# Patient Record
Sex: Female | Born: 1935 | Race: White | Hispanic: No | Marital: Married | Smoking: Former smoker
Health system: Southern US, Community
[De-identification: ages and names within clinical notes are randomized; demographics above are authoritative.]

## PROBLEM LIST (undated history)

## (undated) DIAGNOSIS — T8859XA Other complications of anesthesia, initial encounter: Secondary | ICD-10-CM

## (undated) DIAGNOSIS — T4145XA Adverse effect of unspecified anesthetic, initial encounter: Secondary | ICD-10-CM

## (undated) DIAGNOSIS — Z9889 Other specified postprocedural states: Secondary | ICD-10-CM

## (undated) DIAGNOSIS — F40240 Claustrophobia: Secondary | ICD-10-CM

## (undated) DIAGNOSIS — J40 Bronchitis, not specified as acute or chronic: Secondary | ICD-10-CM

## (undated) DIAGNOSIS — G4730 Sleep apnea, unspecified: Secondary | ICD-10-CM

## (undated) DIAGNOSIS — R011 Cardiac murmur, unspecified: Secondary | ICD-10-CM

## (undated) DIAGNOSIS — Z9109 Other allergy status, other than to drugs and biological substances: Secondary | ICD-10-CM

## (undated) DIAGNOSIS — M1990 Unspecified osteoarthritis, unspecified site: Secondary | ICD-10-CM

## (undated) DIAGNOSIS — R112 Nausea with vomiting, unspecified: Secondary | ICD-10-CM

## (undated) DIAGNOSIS — G2581 Restless legs syndrome: Secondary | ICD-10-CM

## (undated) DIAGNOSIS — R0602 Shortness of breath: Secondary | ICD-10-CM

## (undated) DIAGNOSIS — R52 Pain, unspecified: Secondary | ICD-10-CM

## (undated) DIAGNOSIS — E039 Hypothyroidism, unspecified: Secondary | ICD-10-CM

## (undated) DIAGNOSIS — I10 Essential (primary) hypertension: Secondary | ICD-10-CM

## (undated) HISTORY — PX: OTHER SURGICAL HISTORY: SHX169

## (undated) HISTORY — PX: CARDIAC CATHETERIZATION: SHX172

## (undated) HISTORY — PX: APPENDECTOMY: SHX54

---

## 1962-11-07 HISTORY — PX: OTHER SURGICAL HISTORY: SHX169

## 1980-11-07 HISTORY — PX: BREAST SURGERY: SHX581

## 1998-11-07 HISTORY — PX: ABDOMINAL HYSTERECTOMY: SHX81

## 2009-04-24 ENCOUNTER — Ambulatory Visit: Payer: Self-pay | Admitting: Family Medicine

## 2009-04-24 DIAGNOSIS — M25559 Pain in unspecified hip: Secondary | ICD-10-CM | POA: Insufficient documentation

## 2009-04-24 DIAGNOSIS — I10 Essential (primary) hypertension: Secondary | ICD-10-CM | POA: Insufficient documentation

## 2009-04-24 DIAGNOSIS — M2170 Unequal limb length (acquired), unspecified site: Secondary | ICD-10-CM | POA: Insufficient documentation

## 2009-11-07 HISTORY — PX: EYE SURGERY: SHX253

## 2009-12-08 ENCOUNTER — Ambulatory Visit: Payer: Self-pay | Admitting: Family Medicine

## 2009-12-08 DIAGNOSIS — E039 Hypothyroidism, unspecified: Secondary | ICD-10-CM | POA: Insufficient documentation

## 2009-12-08 DIAGNOSIS — J069 Acute upper respiratory infection, unspecified: Secondary | ICD-10-CM | POA: Insufficient documentation

## 2009-12-08 LAB — CONVERTED CEMR LAB: Rapid Strep: NEGATIVE

## 2010-03-11 ENCOUNTER — Ambulatory Visit: Payer: Self-pay | Admitting: Family Medicine

## 2010-03-11 LAB — CONVERTED CEMR LAB
Basophils Absolute: 0.1 10*3/uL (ref 0.0–0.1)
Bilirubin Urine: NEGATIVE
Glucose, Bld: 131 mg/dL — ABNORMAL HIGH (ref 70–99)
Glucose, Urine, Semiquant: NEGATIVE
Hemoglobin: 14 g/dL (ref 12.0–15.0)
Lymphocytes Relative: 16 % (ref 12–46)
Monocytes Absolute: 0.8 10*3/uL (ref 0.1–1.0)
Neutro Abs: 7.1 10*3/uL (ref 1.7–7.7)
Nitrite: POSITIVE
Potassium: 3.9 meq/L (ref 3.5–5.3)
RDW: 14.8 % (ref 11.5–15.5)
Sodium: 132 meq/L — ABNORMAL LOW (ref 135–145)
pH: 5

## 2010-03-15 ENCOUNTER — Encounter: Payer: Self-pay | Admitting: Family Medicine

## 2010-12-07 NOTE — Letter (Signed)
Summary: Handout Printed  Printed Handout:  - Rheumatic Fever 

## 2010-12-07 NOTE — Assessment & Plan Note (Signed)
Summary: FEVER(3DAYS)/SEVERE HEADACHE/UPSET STOMACH   Vital Signs:  Patient Profile:   76 Years Old Female CC:      Diarrhea, HA, fever X 3 days, chills, dry mouth Height:     60 inches Weight:      228 pounds O2 Sat:      95 % O2 treatment:    Room Air Temp:     99.3 degrees F oral Pulse rate:   100 / minute Pulse rhythm:   regular Resp:     22 per minute BP sitting:   140 / 73  (right arm) Cuff size:   large  Pt. in pain?   no  Vitals Entered By: Lajean Saver RN (Mar 11, 2010 9:53 AM)                    Updated Prior Medication List: MIRAPEX 0.5 MG TABS (PRAMIPEXOLE DIHYDROCHLORIDE)  TWYNSTA 80-5 MG TABS (TELMISARTAN-AMLODIPINE)  INDAPAMIDE 1.25 MG TABS (INDAPAMIDE)  SAVELLA 50 MG TABS (MILNACIPRAN HCL)  LORTAB 5 5-500 MG TABS (HYDROCODONE-ACETAMINOPHEN) One or two tabs by mouth hs as needed pain SYNTHROID 112 MCG TABS (LEVOTHYROXINE SODIUM) once daily LOVAZA 1 GM CAPS (OMEGA-3-ACID ETHYL ESTERS) 2 grams bid  Current Allergies (reviewed today): No known allergies History of Present Illness Chief Complaint: Diarrhea, HA, fever X 3 days, chills, dry mouth History of Present Illness: PATIENT STATES THAT APPROX 5 DAYS AGO SHE NOTED AN UPSENT STOMACH. NO VOMITING. DEVELOPED DIARRHEA THAT LASTED A DAY. THEN NOTED A HAS THAT HAS PERISTED AND FEVER TO AS HIGH A 102.5 . TEMP THIS AM WAS 102. HAS TAKEN TYLENOL. NO RUNNY NOSE, SORE THROAT, COUGH, OR UTI SYMPTOMS. NO KNOWN TICK EXPOSURE. NO RASH.   REVIEW OF SYSTEMS Constitutional Symptoms       Complains of fever, chills, and fatigue.     Denies night sweats, weight loss, and weight gain.      Comments: fever X 3 days Eyes       Denies change in vision, eye pain, eye discharge, glasses, contact lenses, and eye surgery. Ear/Nose/Throat/Mouth       Denies hearing loss/aids, change in hearing, ear pain, ear discharge, dizziness, frequent runny nose, frequent nose bleeds, sinus problems, sore throat, hoarseness, and tooth pain  or bleeding.  Respiratory       Denies dry cough, productive cough, wheezing, shortness of breath, asthma, bronchitis, and emphysema/COPD.  Cardiovascular       Denies murmurs, chest pain, and tires easily with exhertion.    Gastrointestinal       Complains of diarrhea.      Denies stomach pain, nausea/vomiting, constipation, blood in bowel movements, and indigestion.      Comments: 5 days ago, X 2 days Genitourniary       Denies painful urination, kidney stones, and loss of urinary control. Neurological       Complains of headaches.      Denies paralysis, seizures, and fainting/blackouts.      Comments: X 5 days Musculoskeletal       Denies muscle pain, joint pain, joint stiffness, decreased range of motion, redness, swelling, muscle weakness, and gout.  Skin       Denies bruising, unusual mles/lumps or sores, and hair/skin or nail changes.  Psych       Denies mood changes, temper/anger issues, anxiety/stress, speech problems, depression, and sleep problems. Other Comments: dry mouth   Past History:  Past Medical History: Restless Leg Syndrome Fibromyalgia Hypertension Hypothyroidism ?fatty liver  Past Surgical History: Reviewed history from 04/24/2009 and no changes required. Appendectomy Hysterectomy  Family History: Reviewed history from 04/24/2009 and no changes required. Family History of CAD Female 1st degree relative <50 Family History Kidney disease Family History Uterine cancer  Social History: Reviewed history from 04/24/2009 and no changes required. Occupation:Retired Married Never Smoked Alcohol use-no Drug use-no Physical Exam General appearance: well developed, well nourished, no acute distress Head: normocephalic, atraumatic Eyes: conjunctivae and lids normal Nasal: mucosa pink, nonedematous, no septal deviation, turbinates normal Oral/Pharynx: tongue normal, posterior pharynx without erythema or exudate Neck: neck supple,  trachea midline, no  masses Chest/Lungs: no rales, wheezes, or rhonchi bilateral, breath sounds equal without effort Heart: regular rate and  rhythm, no murmur Abdomen: soft, non-tender without obvious organomegaly Extremities: normal extremities Neurological: grossly intact and non-focal Skin: no obvious rashes or lesions WILL CHECK CBC, BMP, AND UA.  LABS OK Assessment New Problems: FEVER (ICD-780.60) URINARY TRACT INFECTION (ICD-599.0)   Plan New Medications/Changes: CIPRO 500 MG TABS (CIPROFLOXACIN HCL) 1 by mouth BID  ##14 x 0, 03/11/2010, Marvis Moeller DO  New Orders: Est. Patient Level III [65784]   Prescriptions: CIPRO 500 MG TABS (CIPROFLOXACIN HCL) 1 by mouth BID  ##14 x 0   Entered and Authorized by:   Marvis Moeller DO   Signed by:   Marvis Moeller DO on 03/11/2010   Method used:   Print then Give to Patient   RxID:   [id number]   Patient Instructions: 1)  TYLENOL OR MOTRIN AS NEEDED. AVOID CAFFEINE AND MILK PRODUCTS. FOLLOW UP WITH YOUR PCP IF SYMPTOMS PERSIST.   Laboratory Results   Urine Tests  Date/Time Received: Mar 11, 2010 10:51 AM  Date/Time Reported: Mar 11, 2010 10:52 AM   Routine Urinalysis   Color: orange Appearance: Cloudy Glucose: negative   (Normal Range: Negative) Bilirubin: negative   (Normal Range: Negative) Ketone: negative   (Normal Range: Negative) Spec. Gravity: 1.025   (Normal Range: 1.003-1.035) Blood: small   (Normal Range: Negative) pH: 5.0   (Normal Range: 5.0-8.0) Protein: 100   (Normal Range: Negative) Urobilinogen: 0.2   (Normal Range: 0-1) Nitrite: positive   (Normal Range: Negative) Leukocyte Esterace: trace   (Normal Range: Negative)

## 2010-12-07 NOTE — Assessment & Plan Note (Signed)
Summary: SWOLLEN LYMPH NODES/KH   Vital Signs:  Patient Profile:   76 Years Old Female CC:      swollen lymph nodes Height:     60 inches Weight:      230 pounds O2 Sat:      95 % O2 treatment:    Room Air Temp:     97.2 degrees F oral Pulse rate:   87 / minute Pulse rhythm:   regular Resp:     20 per minute BP sitting:   159 / 80  (right arm) Cuff size:   regular  Pt. in pain?   no  Vitals Entered By: Lita Mains, RN                   Updated Prior Medication List: MIRAPEX 0.5 MG TABS (PRAMIPEXOLE DIHYDROCHLORIDE)  TWYNSTA 80-5 MG TABS (TELMISARTAN-AMLODIPINE)  INDAPAMIDE 1.25 MG TABS (INDAPAMIDE)  SAVELLA 50 MG TABS (MILNACIPRAN HCL)  LORTAB 5 5-500 MG TABS (HYDROCODONE-ACETAMINOPHEN) One or two tabs by mouth hs as needed pain SYNTHROID 112 MCG TABS (LEVOTHYROXINE SODIUM) once daily LOVAZA 1 GM CAPS (OMEGA-3-ACID ETHYL ESTERS) 2 grams bid  Current Allergies (reviewed today): No known allergies History of Present Illness History from: patient Chief Complaint: swollen lymph nodes History of Present Illness: two days ago patient c/o sore throat. The sore throat has resolved but she still feels her lymph nodes are swollen and sore. She is afebrile and denies any other symptoms.  Subjective:  Patient complains of mild URI symptoms that started about 4 days ago with a sore throat.  She has some mild nasal congestion and cough, now resolved, but still has mild soreness in neck lymph nodes.  No fevers, chills, and sweats.  She feels well otherwise.  REVIEW OF SYSTEMS Constitutional Symptoms      Denies fever, chills, night sweats, weight loss, weight gain, and fatigue.  Eyes       Denies change in vision, eye pain, eye discharge, glasses, contact lenses, and eye surgery. Ear/Nose/Throat/Mouth       Denies hearing loss/aids, change in hearing, ear pain, ear discharge, dizziness, frequent runny nose, frequent nose bleeds, sinus problems, sore throat, hoarseness, and  tooth pain or bleeding.      Comments: swollen lymph nodes Respiratory       Denies dry cough, productive cough, wheezing, shortness of breath, asthma, bronchitis, and emphysema/COPD.  Cardiovascular       Denies murmurs, chest pain, and tires easily with exhertion.    Gastrointestinal       Denies stomach pain, nausea/vomiting, diarrhea, constipation, blood in bowel movements, and indigestion. Genitourniary       Denies painful urination, kidney stones, and loss of urinary control. Neurological       Denies paralysis, seizures, and fainting/blackouts. Musculoskeletal       Denies muscle pain, joint pain, joint stiffness, decreased range of motion, redness, swelling, muscle weakness, and gout.  Skin       Denies bruising, unusual mles/lumps or sores, and hair/skin or nail changes.  Psych       Denies mood changes, temper/anger issues, anxiety/stress, speech problems, depression, and sleep problems.  Past History:  Past Surgical History: Last updated: 04/24/2009 Appendectomy Hysterectomy  Family History: Last updated: 04/24/2009 Family History of CAD Female 1st degree relative <50 Family History Kidney disease Family History Uterine cancer  Social History: Last updated: 04/24/2009 Occupation:Retired Married Never Smoked Alcohol use-no Drug use-no  Past Medical History: Restless Leg Syndrome Fibromyalgia Hypertension Hypothyroidism  Family History: Reviewed history from 04/24/2009 and no changes required. Family History of CAD Female 1st degree relative <50 Family History Kidney disease Family History Uterine cancer  Social History: Reviewed history from 04/24/2009 and no changes required. Occupation:Retired Married Never Smoked Alcohol use-no Drug use-no   Objective:  No acute distress, appears comfortable and alert Eyes:  Pupils are equal, round, and reactive to light and accomdation.  Extraocular movement is intact.  Conjunctivae are not inflamed.  Ears:   Canals normal.  Tympanic membranes normal.   Nose:  Normal septum.  Normal turbinates, mildly congested.   No sinus tenderness present.  Pharynx:  Normal  Neck:  Supple.  Slightly tender shotty anterior/posterior nodes are palpated bilaterally.  Lungs:  Clear to auscultation.  Breath sounds are equal.  Heart:  Regular rate and rhythm without murmurs, rubs, or gallops.  Abdomen:  Nontender without masses or hepatosplenomegaly.  Bowel sounds are present.  No CVA or flank tenderness.  Rapid strep test negative  Assessment New Problems: URI (ICD-465.9) HYPOTHYROIDISM (ICD-244.9)  VIRAL SYNDROME RESOLVING  Plan New Orders: Rapid Strep [11914] Est. Patient Level III [78295] Planning Comments:   Reassurance.  Follow-up with PCP for persistent symptoms    Diagnoses and expected course of recovery discussed and will return if not improved as expected or if the condition worsens. Patient and/or caregiver verbalized understanding.   Laboratory Results  Date/Time Received: December 08, 2009 2:54 PM  Date/Time Reported: December 08, 2009 2:54 PM   Other Tests  Rapid Strep: negative  Kit Test Internal QC: Negative   (Normal Range: Negative)

## 2011-08-05 ENCOUNTER — Ambulatory Visit (HOSPITAL_COMMUNITY)
Admission: RE | Admit: 2011-08-05 | Discharge: 2011-08-05 | Disposition: A | Discharge status: Home / Self Care | Payer: Medicare Other | Source: Ambulatory Visit | Attending: Specialist | Admitting: Specialist

## 2011-08-05 ENCOUNTER — Other Ambulatory Visit: Payer: Self-pay | Admitting: Specialist

## 2011-08-05 ENCOUNTER — Other Ambulatory Visit (HOSPITAL_COMMUNITY): Payer: Self-pay | Admitting: Specialist

## 2011-08-05 ENCOUNTER — Encounter (HOSPITAL_COMMUNITY): Payer: Medicare Other

## 2011-08-05 DIAGNOSIS — Z01812 Encounter for preprocedural laboratory examination: Secondary | ICD-10-CM | POA: Insufficient documentation

## 2011-08-05 DIAGNOSIS — Z01811 Encounter for preprocedural respiratory examination: Secondary | ICD-10-CM

## 2011-08-05 DIAGNOSIS — M25569 Pain in unspecified knee: Secondary | ICD-10-CM

## 2011-08-05 DIAGNOSIS — Z01810 Encounter for preprocedural cardiovascular examination: Secondary | ICD-10-CM | POA: Insufficient documentation

## 2011-08-05 DIAGNOSIS — Z01818 Encounter for other preprocedural examination: Secondary | ICD-10-CM | POA: Insufficient documentation

## 2011-08-05 DIAGNOSIS — M1710 Unilateral primary osteoarthritis, unspecified knee: Secondary | ICD-10-CM | POA: Insufficient documentation

## 2011-08-05 LAB — DIFFERENTIAL
Basophils Absolute: 0 10*3/uL (ref 0.0–0.1)
Basophils Relative: 1 % (ref 0–1)
Eosinophils Absolute: 0.2 10*3/uL (ref 0.0–0.7)
Eosinophils Relative: 2 % (ref 0–5)
Lymphocytes Relative: 31 % (ref 12–46)
Monocytes Absolute: 0.8 10*3/uL (ref 0.1–1.0)

## 2011-08-05 LAB — CBC
HCT: 43.4 % (ref 36.0–46.0)
Hemoglobin: 14.2 g/dL (ref 12.0–15.0)
MCV: 92.5 fL (ref 78.0–100.0)
RBC: 4.69 MIL/uL (ref 3.87–5.11)
WBC: 8 10*3/uL (ref 4.0–10.5)

## 2011-08-05 LAB — URINALYSIS, ROUTINE W REFLEX MICROSCOPIC
Bilirubin Urine: NEGATIVE
Nitrite: POSITIVE — AB
Specific Gravity, Urine: 1.013 (ref 1.005–1.030)
Urobilinogen, UA: 0.2 mg/dL (ref 0.0–1.0)
pH: 7 (ref 5.0–8.0)

## 2011-08-05 LAB — COMPREHENSIVE METABOLIC PANEL
ALT: 55 U/L — ABNORMAL HIGH (ref 0–35)
BUN: 22 mg/dL (ref 6–23)
CO2: 31 mEq/L (ref 19–32)
Calcium: 10.3 mg/dL (ref 8.4–10.5)
Creatinine, Ser: 1.05 mg/dL (ref 0.50–1.10)
GFR calc Af Amer: >60 mL/min (ref >60)
GFR calc non Af Amer: 51 mL/min — ABNORMAL LOW (ref >60)
Glucose, Bld: 95 mg/dL (ref 70–99)
Total Protein: 7.6 g/dL (ref 6.0–8.3)

## 2011-08-05 LAB — URINE MICROSCOPIC-ADD ON

## 2011-08-05 LAB — PROTIME-INR: Prothrombin Time: 13.3 seconds (ref 11.6–15.2)

## 2011-08-12 ENCOUNTER — Inpatient Hospital Stay (HOSPITAL_COMMUNITY)
Admission: RE | Admit: 2011-08-12 | Discharge: 2011-08-17 | DRG: 470 | Disposition: A | Discharge status: Home Health | Payer: Medicare Other | Source: Ambulatory Visit | Attending: Specialist | Admitting: Specialist

## 2011-08-12 ENCOUNTER — Inpatient Hospital Stay (HOSPITAL_COMMUNITY): Payer: Medicare Other

## 2011-08-12 DIAGNOSIS — J45909 Unspecified asthma, uncomplicated: Secondary | ICD-10-CM | POA: Diagnosis present

## 2011-08-12 DIAGNOSIS — I10 Essential (primary) hypertension: Secondary | ICD-10-CM | POA: Diagnosis present

## 2011-08-12 DIAGNOSIS — Z01810 Encounter for preprocedural cardiovascular examination: Secondary | ICD-10-CM

## 2011-08-12 DIAGNOSIS — G4733 Obstructive sleep apnea (adult) (pediatric): Secondary | ICD-10-CM | POA: Diagnosis present

## 2011-08-12 DIAGNOSIS — Z79899 Other long term (current) drug therapy: Secondary | ICD-10-CM

## 2011-08-12 DIAGNOSIS — R011 Cardiac murmur, unspecified: Secondary | ICD-10-CM | POA: Diagnosis present

## 2011-08-12 DIAGNOSIS — N390 Urinary tract infection, site not specified: Secondary | ICD-10-CM | POA: Diagnosis not present

## 2011-08-12 DIAGNOSIS — M21169 Varus deformity, not elsewhere classified, unspecified knee: Secondary | ICD-10-CM | POA: Diagnosis present

## 2011-08-12 DIAGNOSIS — Z87891 Personal history of nicotine dependence: Secondary | ICD-10-CM

## 2011-08-12 DIAGNOSIS — E871 Hypo-osmolality and hyponatremia: Secondary | ICD-10-CM | POA: Diagnosis not present

## 2011-08-12 DIAGNOSIS — R269 Unspecified abnormalities of gait and mobility: Secondary | ICD-10-CM | POA: Diagnosis present

## 2011-08-12 DIAGNOSIS — E039 Hypothyroidism, unspecified: Secondary | ICD-10-CM | POA: Diagnosis present

## 2011-08-12 DIAGNOSIS — Z01812 Encounter for preprocedural laboratory examination: Secondary | ICD-10-CM

## 2011-08-12 DIAGNOSIS — M1710 Unilateral primary osteoarthritis, unspecified knee: Principal | ICD-10-CM | POA: Diagnosis present

## 2011-08-12 HISTORY — PX: JOINT REPLACEMENT: SHX530

## 2011-08-12 LAB — URINALYSIS, ROUTINE W REFLEX MICROSCOPIC
Glucose, UA: NEGATIVE mg/dL
Hgb urine dipstick: NEGATIVE
Ketones, ur: NEGATIVE mg/dL
Protein, ur: NEGATIVE mg/dL

## 2011-08-12 LAB — TYPE AND SCREEN
ABO/RH(D): O POS
Antibody Screen: NEGATIVE

## 2011-08-12 LAB — ABO/RH: ABO/RH(D): O POS

## 2011-08-13 LAB — BASIC METABOLIC PANEL
Chloride: 98 mEq/L (ref 96–112)
Creatinine, Ser: 0.85 mg/dL (ref 0.50–1.10)
GFR calc Af Amer: 76 mL/min — ABNORMAL LOW (ref >90)
Potassium: 4.1 mEq/L (ref 3.5–5.1)

## 2011-08-13 LAB — CBC
Platelets: 191 10*3/uL (ref 150–400)
RDW: 14.2 % (ref 11.5–15.5)
WBC: 9.2 10*3/uL (ref 4.0–10.5)

## 2011-08-13 NOTE — Op Note (Signed)
NAMECLOIE, WOODEN NO.:  [id number]  MEDICAL RECORD NO.:  [id number]  LOCATION:  1602                         FACILITY:  [facility]  PHYSICIAN:  Jene Every, M.D.    DATE OF BIRTH:  28-Feb-1935  DATE OF PROCEDURE:  08/12/2011 DATE OF DISCHARGE:                              OPERATIVE REPORT   PREOPERATIVE DIAGNOSIS:  End-stage osteoarthritis of the left knee with bone-on-bone deformity, varus.  POSTOPERATIVE DIAGNOSIS:  End-stage osteoarthritis of the left knee with bone-on-bone deformity, varus.  PROCEDURE PERFORMED:  Left total knee arthroplasty.  ANESTHESIA:  Roma Schanz, P.A.  Assistant utilized for retraction, closure, and protection of the soft tissue structures.  BRIEF HISTORY:  76-year with end-stage osteoarthrosis, bone on bone medial compartment, varus deformity, fracture of wrist, activity modification, assisting devices, and corticosteroid injections.  X-rays indicated bone-on-bone arthritis indicated replacement of degenerative joint.  Risk and benefits discussed, including bleeding, infection, damage to bowel structures, no change in symptoms, worsening symptoms, need for repeat procedure, component failure, arthrofibrosis, need for manipulation, etc.  With the patient in supine position after adequate general anesthesia and 2 g Kefzol, left lower extremity was prepped and draped in usual sterile fashion.  Thigh tourniquet inflated at 300 mmHg.  Midline incision was then made over the patella.  Subcutaneous tissue was dissected.  Cautery was utilized to achieve hemostasis.  Full-thickness flaps developed median parapatellar arthrotomy performed within an hour and a half.  Patella was everted.  Copious portions of the clear synovial fluid evacuated.  Tricompartmental severe osteoarthrosis bone- on-bone was noted.  Knee was flexed.  Leksell utilized to remove osteophytes.  ACL and menisci remnants were removed.  Geniculate  was cauterized.  We then elevated the soft tissues medially preserving the MCL though.  This was a varus deformity.  Step drill utilized to enter the femoral canal, it was irrigated.  T-handled reamer then placed, 5 degrees left, 12 off the distal femur due to the flexion contracture, formed cut off the distal femur protecting the soft tissues at all times.  Attention turned towards the tibia subluxed with the tail, external alignment guide bisecting the ankle parallel to the tibial shaft, taken off the high side which was lateral.  The proximal tibial cut was made.  Flexion extension gaps were fashioned.  They were equivalent.  We checked posteriorly with the knee in flexion, removed any remnants of menisci.  Preserved the popliteus.  Attention turned back towards completing the tibia, subluxed trial to a 2.5 just the medial aspect of the tibial tubercle, full coverage, maximizing this. This was then drilled centrally and punch guide was utilized.  Attention turned towards the completion of the femur.  We then measured off the anterior aspect of the femur, previously with measurement of 2.5 off the anterior cortex.  Obtained 3 degrees of external rotation.  Then we performed our anterior, posterior, and chamfer cuts.  There was no notching of the femur.  Then performed our box cut, placed in a cutting guide, bisecting the notch.  The box cut was then performed.  Again soft tissue protected at all time.  We used a trial tibia and femur 10 mm insert full extension, full flexion,  good stability, varus valgus stress at 0 and 30 degrees.  Slight laxity with a varus stress.  Attention turned towards the patella, measured a 23 thickness 32 patella.  We planed it to a 16 mm, drilled peg holes, medializing those and placed a trial patella, and there was satisfactory patellofemoral tracking.  All trials were then removed.  Used pulsatile lavage to clean the wound and the bony surfaces.  Mixed  cement on the back table in the appropriate fashion.  Knee was flexed, all surfaces dried and a keel was used, injected cement into the proximal tibia and digitally pressurizing this and impacted a 2.5 permanent component.  Redundant cement removed.  We tried the distal femur, cemented it and impacted the femoral component. Redundant cement removed.  Placed a 10 mm insert reduced it and held it with an axial load throughout the curing of cement and then cemented the patella as well after drying the surface.  Copiously irrigated after full curing of the cement although there was slight laxity with the varus stress.  We, therefore, placed a 12 mm insert trial, and this was optimal.  After removing this and irrigating the wound, we removed redundant cement.  Thoroughly inspected the joint.  We placed a 12 mm insert rotating platform, reduced it, full extension, full flexion, good stability, varus valgus stressing 0-30 degrees, and normal patellofemoral tracking.  Then placed a Hemovac, brought out the lateral stab wound on the skin.  The parapatellar arthrotomy with #1 Vicryl interrupted figure-of-eight sutures, subcu with 2-0 Vicryl simple sutures, and skin was reapproximated staples.  The patient had flexion to 90 degrees gravity.  Sterile dressing was applied.  Tourniquet was deflated, and there was adequate vascularization of lower extremity appreciated.  The patient tolerated the procedure well.  No complications.  TOURNIQUET TIME:  1 hour and a half.     Jene Every, M.D.     Cordelia Pen  D:  08/12/2011  T:  08/12/2011  Job:  [id number]  Electronically Signed by Jene Every M.D. on 08/13/2011 09:36:14 AM

## 2011-08-13 NOTE — H&P (Signed)
NAMEEMINE, LOPATA NO.:  [id number]  MEDICAL RECORD NO.:  [id number]  LOCATION:                               FACILITY:  [facility]  PHYSICIAN:  Jene Every, M.D.    DATE OF BIRTH:  1935-09-19  DATE OF ADMISSION:  08/12/2011 DATE OF DISCHARGE:                             HISTORY & PHYSICAL   CHIEF COMPLAINT:  Left knee pain.  HISTORY:  Patient is a pleasant 76 year old female with a longstanding history of bilateral knee pain.  She has undergone conservative treatment for quite some time with persistent disabling symptoms.  She has now noted instability of the knee that is fairly disabling for her. Radiographs were reviewed which did show bone-on-bone arthritis bilaterally; however, she is noting left greater than right knee pain. It is felt this time that she would benefit from a total knee arthroplasty.  The risks and benefits of this were discussed with the patient and she did obtain medical clearance and elect to proceed.  MEDICAL HISTORY:  Significant for: 1. Sleep apnea. 2. Hypothyroidism. 3. Patient does require oxygen at night through her CPAP. 4. Hypertension. 5. Asthma. 6. History of heart murmur.  PAST HOSPITALIZATIONS:  She did have desaturations to around 60 to 70% which is why she is on oxygen therapy at night.  MEDICATIONS:  Include: 1. Synthroid 125 mcg daily. 2. Mirapex 0.5 mg two daily. 3. Cyanocobalamin 1 mL, 1 time a month. 4. Vitamin B6. 5. Vitamin C. 6. Vitamin D3. 7. Lovaza 1 g two tabs a day. 8. Magnesium 250 mg daily. 9. Twynsta 80/5 mg, one p.o. daily. 10.Indapamide 1.25 mg daily.  ALLERGIES:  None listed.  PREVIOUS SURGERIES:  Include: 1. Appendectomy. 2. Resection of ovaries. 3. Full hysterectomy. 4. Removal of lipomas. 5. Benign breast cyst was removed.  SOCIAL HISTORY:  Patient is married.  She is a Futures trader.  She was a previous smoker, smoked 2 to 3 packs of cigarettes for 30 years.  She occasionally does  consume alcohol.  Family will be caregivers following surgery.  She plans to go home with the help of her husband.  FAMILY HISTORY:  Father deceased of MI.  Mother deceased of uterine cancer.  She did have a sibling who passed from renal failure.  REVIEW OF SYSTEMS:  GENERAL:  The patient denies any fever, chills, night sweats, bleeding, or tendencies.  CNS:  No blurred or double vision, seizure, headache, or paralysis.  RESPIRATORY:  No shortness of breath, productive cough, or hemoptysis.  CARDIOVASCULAR:  No chest pain or history of orthopnea.  GU:  No dysuria, hematuria, or discharge.  GI: No nausea, vomiting, diarrhea, constipation, or melena. MUSCULOSKELETAL:  As per in the HPI.  PHYSICAL EXAMINATION:  VITAL SIGNS:  Weight is 222.5, height 60.5, BP 142/70. GENERAL:  This is an overweight female who does walk with antalgic gait, utilizing no assistive device. HEENT:  Atraumatic, normocephalic. NECK:  Supple.  No lymphadenopathy. CHEST:  Clear to auscultation bilaterally. HEART:  She does have murmur.  Regular rate and rhythm without gallops or rubs. ABDOMEN:  Soft, nontender, protuberant.  Bowel sounds x4. SKIN:  No rashes or lesions are noted. EXTREMITIES:  In regards to  left knee, she does have mild effusion. Range of motion is -5 to 90 degrees.  She has moderate crepitus on exam.  IMPRESSION:  Bone-on-bone arthritis of the left knee.  PLAN:  The patient will be admitted to undergo a left total knee arthroplasty.  As of note, she requests spinal for anesthesia.     Roma Schanz, P.A.   ______________________________ Jene Every, M.D.    CS/MEDQ  D:  08/10/2011  T:  08/11/2011  Job:  [id number]  Electronically Signed by Roma Schanz P.A. on 08/12/2011 01:27:47 PM Electronically Signed by Jene Every M.D. on 08/13/2011 09:36:12 AM

## 2011-08-14 LAB — CBC
HCT: 33.6 % — ABNORMAL LOW (ref 36.0–46.0)
Hemoglobin: 11.3 g/dL — ABNORMAL LOW (ref 12.0–15.0)
RDW: 13.8 % (ref 11.5–15.5)
WBC: 9.5 10*3/uL (ref 4.0–10.5)

## 2011-08-14 LAB — BASIC METABOLIC PANEL
BUN: 10 mg/dL (ref 6–23)
Chloride: 93 mEq/L — ABNORMAL LOW (ref 96–112)
GFR calc Af Amer: 74 mL/min — ABNORMAL LOW (ref >90)
Potassium: 3.7 mEq/L (ref 3.5–5.1)

## 2011-08-15 LAB — BASIC METABOLIC PANEL
BUN: 14 mg/dL (ref 6–23)
CO2: 30 mEq/L (ref 19–32)
Chloride: 93 mEq/L — ABNORMAL LOW (ref 96–112)
GFR calc non Af Amer: 56 mL/min — ABNORMAL LOW (ref >90)
Glucose, Bld: 112 mg/dL — ABNORMAL HIGH (ref 70–99)
Potassium: 3.8 mEq/L (ref 3.5–5.1)

## 2011-08-15 LAB — CBC
HCT: 32.9 % — ABNORMAL LOW (ref 36.0–46.0)
Hemoglobin: 10.9 g/dL — ABNORMAL LOW (ref 12.0–15.0)
MCHC: 33.1 g/dL (ref 30.0–36.0)

## 2011-08-16 LAB — CBC
HCT: 31.9 % — ABNORMAL LOW (ref 36.0–46.0)
Hemoglobin: 10.7 g/dL — ABNORMAL LOW (ref 12.0–15.0)
MCH: 30.5 pg (ref 26.0–34.0)
MCHC: 33.5 g/dL (ref 30.0–36.0)
MCV: 90.9 fL (ref 78.0–100.0)

## 2011-08-16 LAB — BASIC METABOLIC PANEL
BUN: 19 mg/dL (ref 6–23)
Calcium: 9.4 mg/dL (ref 8.4–10.5)
GFR calc non Af Amer: 49 mL/min — ABNORMAL LOW (ref >90)
Glucose, Bld: 115 mg/dL — ABNORMAL HIGH (ref 70–99)

## 2011-08-17 LAB — BASIC METABOLIC PANEL
Calcium: 9.3 mg/dL (ref 8.4–10.5)
Creatinine, Ser: 0.99 mg/dL (ref 0.50–1.10)
GFR calc non Af Amer: 55 mL/min — ABNORMAL LOW (ref >90)
Glucose, Bld: 107 mg/dL — ABNORMAL HIGH (ref 70–99)
Sodium: 133 mEq/L — ABNORMAL LOW (ref 135–145)

## 2011-08-17 LAB — CBC
MCH: 30.4 pg (ref 26.0–34.0)
MCHC: 33.1 g/dL (ref 30.0–36.0)
MCV: 91.9 fL (ref 78.0–100.0)
Platelets: 233 10*3/uL (ref 150–400)

## 2011-09-09 NOTE — Discharge Summary (Signed)
Monique Hudson, Monique Hudson             ACCOUNT NO.:  [id number]  MEDICAL RECORD NO.:  [id number]  LOCATION:  1602                         FACILITY:  [facility]  PHYSICIAN:  Jene Every, M.D.    DATE OF BIRTH:  1935-11-03  DATE OF ADMISSION:  08/12/2011 DATE OF DISCHARGE:  08/17/2011                              DISCHARGE SUMMARY   ADMISSION DIAGNOSES: 1. End-stage osteoarthritis of the left knee with bone-on-bone     deformity. 2. History of sleep apnea. 3. Hyperthyroidism. 4. Requiring oxygen at night. 5. Hypertension. 6. Asthma. 7. History of heart murmur.  DISCHARGE DIAGNOSES: 1. End-stage osteoarthritis of the left knee with bone-on-bone     deformity. 2. History of sleep apnea. 3. Hyperthyroidism. 4. Requiring oxygen at night. 5. Hypertension. 6. Asthma. 7. History of heart murmur. 8. Status post left total knee arthroplasty. 9. Improved hyponatremia. 10.Asymptomatic postoperative anemia.  HISTORY:  The patient is a pleasant 76 year old female who is a long- standing patient to our office.  She has chronic bilateral knee pain. She has undergone conservative treatment including injections, antiinflammatories with little to no relief of her symptoms.  She describes her pain as disabling.  Radiographs do reveal bone-on-bone arthritis bilaterally; however, she has had left greater than right knee pain.  So at this time, she would benefit from a total knee arthroplasty.  The risks and benefits of the surgery were discussed with the patient, and she does wish to proceed.  PROCEDURE:  The patient was taken to the OR, underwent left total knee arthroplasty.  Surgeon:  Jene Every, M.D.  Assistant:  Roma Schanz, PA-C.  Anesthesia:  General.  Complications:  None.  CONSULTANTS:  PT/OT and Care Management.  LABORATORY DATA:  Admission labs showed white cell count 9.2, hemoglobin 11.7, hematocrit 36.5.  This was monitored throughout the hospital stay. White cell count  remained normal at 6.3 at discharge, hemoglobin decreased to 10.5, hematocrit 31.7; however, the patient was asymptomatic.  Postoperative chemistry showed sodium 132, potassium 4.1, with a slightly elevated glucose to 137, BUN of 15, creatinine 0.85. The patient's sodium did drop to a level of 127 with her chloride dropping to low of 92 and creatinine increased at 1.08.  The patient's fluids were held, but then felt some of this was secondary to dehydration.  Fluid restrictions were lifted at time of discharge, sodium had improved to 133, chloride to 95, creatinine 0.99.  GFR at discharge was 55.  Admission urinalysis showed small leukocyte esterase with a few epithelial cells noted, 7 to 10 wbc's were noted per high- power field.  The patient was treated with Cipro for uncomplicated UTI.  HOSPITAL COURSE:  The patient was admitted, taken to the OR, underwent the above-stated procedure.  One Hemovac drain was placed intraoperatively.  She was then transferred to the PACU, and then to the orthopedic floor for continued postoperative care.  She was placed on PCA analgesics.  On postop day #1, the patient was doing fairly well, pain as expected.  She denied any chest pain or shortness of breath. Vital signs were stable.  Hemoglobin stable at 11.7, slight decrease in sodium at 132.  Hemovac was discontinued with tip intact.  PCA  was weaned.  We did decrease her IV fluids.  Xarelto was started per Pharmacy, Cipro was started for her UTI.  The patient was very slow to progress with physical therapy; however, remained stable throughout the hospital stay.  Again, her sodium did trend downward.  They felt this was from fluid overload; therefore, they placed her on a fluid restriction.  On postop day #2, dressing was changed.  Incision was clean, dry, and intact.  Over the course of the next few days, the patient did slowly begin to increase her ambulation with therapy, and was doing well.  Sodium  did come up slightly at 128 with fluid restrictions.  Over the course of the next couple of days, sodium remained stable, but creatinine started creeping up.  Therefore, we felt sodium levels were secondary to dehydration.  We encouraged the patient to hydrate well, and encouraged good p.o. intake with improvement in her hyponatremia as well as her creatinine.  On postop day #5, the patient had met all goals, pain was well controlled.  She was voiding without difficulty.  It was felt at this time that she was stable to be discharged home.  DISPOSITION:  The patient discharged home with home health needs met.  FOLLOWUP:  She is to follow up with Dr. Shelle Iron in 14 days from surgery for suture removal and x-ray.  ACTIVITY:  She ambulate as tolerated.  Full weightbearing utilizing the knee immobilizer until she can straight leg raise x10.  She will continue with elevation up to 6 times a day, 20 minutes at a time.  DISCHARGE MEDICATIONS:  As per med rec sheet which she will need to be on Xarelto for a total of 21 days for DVT prophylaxis.  Following that, she will take baby aspirin.  DIET:  As tolerated. CONDITION ON DISCHARGE:  Stable.  FINAL DIAGNOSIS:  Doing well status post left total knee arthroplasty.     Roma Schanz, P.A.   ______________________________ Jene Every, M.D.    CS/MEDQ  D:  08/26/2011  T:  08/26/2011  Job:  [id number]  Electronically Signed by Roma Schanz P.A. on 09/06/2011 09:05:19 AM Electronically Signed by Jene Every M.D. on 09/09/2011 04:37:14 PM

## 2012-01-02 ENCOUNTER — Other Ambulatory Visit: Payer: Self-pay | Admitting: Otolaryngology

## 2012-01-31 NOTE — H&P (Signed)
Chief Complaint:  right knee pain.  Subjective: Patient is admitted for right total knee arthroplasty.  Patient is a 77 y.o. female who has been seen by Dr. Shelle Iron for ongoing hip pain.  They have been followed and found to have continued progressive pain.  They have been treated conservatively in the past including medications.  Despite conservative measures, they continue to have pain.  X-rays show that the patient has bone on bone arthritis of her right knee.  It is felt that they would benefit from undergoing surgical intervention.  Risks and benefits have been discussed with the patient and they elect to proceed with surgery.  The patient has no contraindications to the upcoming procedure such as ongoing infection or progressive neurological disease.  Allergies: NKDA   Medications: Cyanocobalamin (1000MCG/ML Solution, Injection) Active. Mirapex ER ( Oral) Specific dose unknown - Active. Lovaza (1GM Capsule, Oral) Active. Vitamin D3 (5000UNIT Capsule, Oral) Active. Synthroid ( Oral) Specific dose unknown - Active. Indapamide ( Oral) Specific dose unknown - Active. Twynsta ( Oral) Specific dose unknown - Active.  Past Medical History: Asthma Chronic Obstructive Lung Disease High blood pressure Hypothyroidism Osteoarthritis Other disease, cancer, significant illness. RESTLESS LEGS Sleep Apnea pt does not use CPAP just 2L O2 via nasal cannula  Past Surgical History: Appendectomy Arthroscopy of Shoulder. right Breast Biopsy. left Carpal Tunnel Repair. right Cataract Surgery. bilateral Dilation and Curettage of Uterus Hysterectomy. complete (cancerous) Total Knee Replacement. left   Social History: Tobacco use. Former smoker. former smoker; smoke(d) 3 or more pack(s) per day Alcohol use. current drinker; drinks wine; only occasionally per week Children. 3 Current work status. retired Financial planner (Currently). no Exercise. Exercises weekly; does  other Illicit drug use. no Living situation. live with spouse Marital status. married Number of flights of stairs before winded. 1 Pain Contract. no Previously in rehab. no Tobacco / smoke exposure. no  Review of Systems A comprehensive review of systems was negative except for: Ears, nose, mouth, throat, and face: positive for nasal congestion  Physical Exam:  Vitals: Pulse:90 Respirations:10 Blood Pressure:159/87  General Appearance:    Alert, cooperative, no distress, appears stated age  Head:    Normocephalic, without obvious abnormality, atraumatic  Eyes:    PERRL, conjunctiva/corneas clear, EOM's intact, fundi    benign, both eyes           Neck:   Supple, symmetrical, trachea midline, no adenopathy;    thyroid:  no enlargement/tenderness/nodules; no carotid   bruit or JVD     Lungs:     Clear to auscultation bilaterally, respirations unlabored  Chest Wall:    No tenderness or deformity   Heart:    Regular rate and rhythm, S1 and S2 normal, no murmur, rub   or gallop     Abdomen:     Soft, non-tender, bowel sounds active all four quadrants,    no masses, no organomegaly        Extremities:   Mild effusion right knee, ROM -5 to 110, TTP medial joint line.    Pulses:   2+ and symmetric all extremities  Skin:   Skin color, texture, turgor normal, no rashes or lesions          Assessment/Plan: End stage arthritis, right knee  The patient is being admitted to [facility] to undergo a right total knee arthroplasty.  Surgery will be performed by Dr. Jene Every.  Risks and benefits have been discussed with the patient and they elect to proceed  wth the procedure.  Patient plans to go home after surgery with [facility] as before for therapy.

## 2012-02-07 ENCOUNTER — Other Ambulatory Visit (HOSPITAL_COMMUNITY): Payer: Medicare Other

## 2012-02-08 ENCOUNTER — Ambulatory Visit (HOSPITAL_COMMUNITY)
Admission: RE | Admit: 2012-02-08 | Discharge: 2012-02-08 | Disposition: A | Discharge status: Home / Self Care | Payer: Medicare Other | Source: Ambulatory Visit | Attending: Otolaryngology | Admitting: Otolaryngology

## 2012-02-08 ENCOUNTER — Encounter (HOSPITAL_COMMUNITY)
Admission: RE | Admit: 2012-02-08 | Discharge: 2012-02-08 | Disposition: A | Discharge status: Home / Self Care | Payer: Medicare Other | Source: Ambulatory Visit | Attending: Specialist | Admitting: Specialist

## 2012-02-08 ENCOUNTER — Encounter (HOSPITAL_COMMUNITY): Payer: Self-pay | Admitting: Pharmacy Technician

## 2012-02-08 ENCOUNTER — Encounter (HOSPITAL_COMMUNITY): Payer: Self-pay

## 2012-02-08 DIAGNOSIS — Z01818 Encounter for other preprocedural examination: Secondary | ICD-10-CM | POA: Insufficient documentation

## 2012-02-08 DIAGNOSIS — Z01812 Encounter for preprocedural laboratory examination: Secondary | ICD-10-CM | POA: Insufficient documentation

## 2012-02-08 HISTORY — DX: Other complications of anesthesia, initial encounter: T88.59XA

## 2012-02-08 HISTORY — DX: Sleep apnea, unspecified: G47.30

## 2012-02-08 HISTORY — DX: Restless legs syndrome: G25.81

## 2012-02-08 HISTORY — DX: Unspecified osteoarthritis, unspecified site: M19.90

## 2012-02-08 HISTORY — DX: Shortness of breath: R06.02

## 2012-02-08 HISTORY — DX: Cardiac murmur, unspecified: R01.1

## 2012-02-08 HISTORY — DX: Hypothyroidism, unspecified: E03.9

## 2012-02-08 HISTORY — DX: Bronchitis, not specified as acute or chronic: J40

## 2012-02-08 HISTORY — DX: Essential (primary) hypertension: I10

## 2012-02-08 HISTORY — DX: Claustrophobia: F40.240

## 2012-02-08 HISTORY — DX: Adverse effect of unspecified anesthetic, initial encounter: T41.45XA

## 2012-02-08 LAB — URINALYSIS, ROUTINE W REFLEX MICROSCOPIC
Ketones, ur: NEGATIVE mg/dL
Leukocytes, UA: NEGATIVE
Nitrite: POSITIVE — AB
Urobilinogen, UA: 1 mg/dL (ref 0.0–1.0)
pH: 6 (ref 5.0–8.0)

## 2012-02-08 LAB — SURGICAL PCR SCREEN
MRSA, PCR: NEGATIVE
Staphylococcus aureus: NEGATIVE

## 2012-02-08 LAB — DIFFERENTIAL
Basophils Relative: 1 % (ref 0–1)
Lymphs Abs: 2.3 10*3/uL (ref 0.7–4.0)
Monocytes Relative: 7 % (ref 3–12)
Neutro Abs: 4.6 10*3/uL (ref 1.7–7.7)
Neutrophils Relative %: 60 % (ref 43–77)

## 2012-02-08 LAB — URINE MICROSCOPIC-ADD ON

## 2012-02-08 LAB — COMPREHENSIVE METABOLIC PANEL
Albumin: 3.7 g/dL (ref 3.5–5.2)
BUN: 27 mg/dL — ABNORMAL HIGH (ref 6–23)
Creatinine, Ser: 1 mg/dL (ref 0.50–1.10)
Total Bilirubin: 0.3 mg/dL (ref 0.3–1.2)
Total Protein: 7.1 g/dL (ref 6.0–8.3)

## 2012-02-08 LAB — PROTIME-INR
INR: 0.98 (ref 0.00–1.49)
Prothrombin Time: 13.2 seconds (ref 11.6–15.2)

## 2012-02-08 LAB — CBC
HCT: 41.6 % (ref 36.0–46.0)
MCHC: 31.3 g/dL (ref 30.0–36.0)
MCV: 93.7 fL (ref 78.0–100.0)
RDW: 14.2 % (ref 11.5–15.5)

## 2012-02-08 NOTE — Patient Instructions (Addendum)
YOUR SURGERY IS SCHEDULED ON Thursday  4/11  AT 7:30 AM  REPORT TO [place] SHORT STAY [facility]ENTER AT:  5:30 AM      PHONE # FOR SHORT STAY IS [phone]  DO NOT EAT OR DRINK ANYTHING AFTER MIDNIGHT THE NIGHT BEFORE YOUR SURGERY.  YOU MAY BRUSH YOUR TEETH, RINSE OUT YOUR MOUTH--BUT NO WATER, NO FOOD, NO CHEWING GUM, NO MINTS, NO CANDIES, NO CHEWING TOBACCO.  PLEASE TAKE THE FOLLOWING MEDICATIONS THE AM OF YOUR SURGERY WITH A FEW SIPS OF WATER:  SYNTHROID AND MIRAPEX    IF YOU USE INHALERS--USE YOUR INHALERS THE AM OF YOUR SURGERY AND BRING INHALERS TO THE HOSPITAL -TAKE TO SURGERY.  (BRING ALBUTEROL INHALER)  IF YOU ARE DIABETIC:  DO NOT TAKE ANY DIABETIC MEDICATIONS THE AM OF YOUR SURGERY.  IF YOU TAKE INSULIN IN THE EVENINGS--PLEASE ONLY TAKE 1/2 NORMAL EVENING DOSE THE NIGHT BEFORE YOUR SURGERY.  NO INSULIN THE AM OF YOUR SURGERY.  IF YOU HAVE SLEEP APNEA AND USE CPAP OR BIPAP--PLEASE BRING THE MASK --NOT THE MACHINE-NOT THE TUBING   -JUST THE MASK. DO NOT BRING VALUABLES, MONEY, CREDIT CARDS.  CONTACT LENS, DENTURES / PARTIALS, GLASSES SHOULD NOT BE WORN TO SURGERY AND IN MOST CASES-HEARING AIDS WILL NEED TO BE REMOVED.  BRING YOUR GLASSES CASE, ANY EQUIPMENT NEEDED FOR YOUR CONTACT LENS. FOR PATIENTS ADMITTED TO THE HOSPITAL--CHECK OUT TIME THE DAY OF DISCHARGE IS 11:00 AM.  ALL INPATIENT ROOMS ARE PRIVATE - WITH BATHROOM, TELEPHONE, TELEVISION AND WIFI INTERNET. IF YOU ARE BEING DISCHARGED THE SAME DAY OF YOUR SURGERY--YOU CAN NOT DRIVE YOURSELF HOME--AND SHOULD NOT GO HOME ALONE BY TAXI OR BUS.  NO DRIVING OR OPERATING MACHINERY FOR 24 HOURS FOLLOWING ANESTHESIA / PAIN MEDICATIONS.                            SPECIAL INSTRUCTIONS:  CHLORHEXIDINE SOAP SHOWER (other brand names are Betasept and Hibiclens ) PLEASE SHOWER WITH CHLORHEXIDINE THE NIGHT BEFORE YOUR SURGERY AND THE AM OF YOUR SURGERY. DO NOT USE CHLORHEXIDINE ON YOUR FACE OR PRIVATE AREAS--YOU MAY USE YOUR NORMAL SOAP THOSE  AREAS AND YOUR NORMAL SHAMPOO.  WOMEN SHOULD AVOID SHAVING UNDER ARMS AND SHAVING LEGS 48 HOURS BEFORE USING CHLORHEXIDINE TO AVOID SKIN IRRITATION.  DO NOT USE IF ALLERGIC TO CHLORHEXIDINE.  PLEASE READ OVER ANY  FACT SHEETS THAT YOU WERE GIVEN: MRSA INFORMATION, BLOOD TRANSFUSION INFORMATION, INCENTIVE SPIROMETER INFORMATION.

## 2012-02-08 NOTE — Pre-Procedure Instructions (Signed)
PT HAS CXR AND EKG REPORTS FROM [facility] -DONE 08/05/11 - REPORTS ARE IN EPIC AND COPIES ARE ON PT'S CHART. PT BROUGHT ENVELOPE FROM DR. BEANE'S OFFICE WITH HER H&P -FORMS PLACED ON PT'S CHART. SLEEP STUDY REPORTS FROM [facility] FROM 05/09/11 AND 05/05/11 ON PT'S CHART.

## 2012-02-08 NOTE — Pre-Procedure Instructions (Signed)
CHRISTINE STRADER PA NOTIFIED PT'S PREOP BUN 27, URINALYSIS POSITIVE NITRATE, WBC'S 3-6, BACTERIA FEW.

## 2012-02-10 ENCOUNTER — Encounter (HOSPITAL_COMMUNITY): Payer: Self-pay

## 2012-02-16 ENCOUNTER — Ambulatory Visit (HOSPITAL_COMMUNITY): Payer: Medicare Other | Admitting: Registered Nurse

## 2012-02-16 ENCOUNTER — Encounter (HOSPITAL_COMMUNITY): Payer: Self-pay | Admitting: *Deleted

## 2012-02-16 ENCOUNTER — Encounter (HOSPITAL_COMMUNITY)
Admission: RE | Disposition: A | Discharge status: Home Health | Payer: Self-pay | Source: Ambulatory Visit | Attending: Specialist

## 2012-02-16 ENCOUNTER — Inpatient Hospital Stay (HOSPITAL_COMMUNITY)
Admission: RE | Admit: 2012-02-16 | Discharge: 2012-02-19 | DRG: 470 | Disposition: A | Discharge status: Home Health | Payer: Medicare Other | Source: Ambulatory Visit | Attending: Specialist | Admitting: Specialist

## 2012-02-16 ENCOUNTER — Encounter (HOSPITAL_COMMUNITY): Payer: Self-pay | Admitting: Registered Nurse

## 2012-02-16 ENCOUNTER — Inpatient Hospital Stay (HOSPITAL_COMMUNITY): Payer: Medicare Other

## 2012-02-16 DIAGNOSIS — F411 Generalized anxiety disorder: Secondary | ICD-10-CM | POA: Diagnosis present

## 2012-02-16 DIAGNOSIS — Z6841 Body Mass Index (BMI) 40.0 and over, adult: Secondary | ICD-10-CM

## 2012-02-16 DIAGNOSIS — I10 Essential (primary) hypertension: Secondary | ICD-10-CM | POA: Diagnosis present

## 2012-02-16 DIAGNOSIS — R0681 Apnea, not elsewhere classified: Secondary | ICD-10-CM | POA: Diagnosis present

## 2012-02-16 DIAGNOSIS — M1710 Unilateral primary osteoarthritis, unspecified knee: Principal | ICD-10-CM | POA: Diagnosis present

## 2012-02-16 DIAGNOSIS — Z96659 Presence of unspecified artificial knee joint: Secondary | ICD-10-CM

## 2012-02-16 DIAGNOSIS — E039 Hypothyroidism, unspecified: Secondary | ICD-10-CM | POA: Diagnosis present

## 2012-02-16 HISTORY — PX: TOTAL KNEE ARTHROPLASTY: SHX125

## 2012-02-16 SURGERY — ARTHROPLASTY, KNEE, TOTAL
Anesthesia: Spinal | Site: Knee | Laterality: Right | Wound class: Clean

## 2012-02-16 MED ORDER — FENTANYL CITRATE 0.05 MG/ML IJ SOLN
INTRAMUSCULAR | Status: DC | PRN
  Administered 2012-02-16: 50 ug via INTRAVENOUS

## 2012-02-16 MED ORDER — SODIUM CHLORIDE 0.9 % IR SOLN
Status: DC | PRN
  Administered 2012-02-16: 09:00:00

## 2012-02-16 MED ORDER — CLINDAMYCIN PHOSPHATE 600 MG/50ML IV SOLN
INTRAVENOUS | Status: DC | PRN
  Administered 2012-02-16: 600 mg via INTRAVENOUS

## 2012-02-16 MED ORDER — CEFAZOLIN SODIUM-DEXTROSE 2-3 GM-% IV SOLR
2.0000 g | INTRAVENOUS | Status: AC
  Administered 2012-02-16: 2 g via INTRAVENOUS

## 2012-02-16 MED ORDER — ACETAMINOPHEN 10 MG/ML IV SOLN
INTRAVENOUS | Status: DC | PRN
  Administered 2012-02-16: 1000 mg via INTRAVENOUS

## 2012-02-16 MED ORDER — HYDROMORPHONE HCL PF 1 MG/ML IJ SOLN
INTRAMUSCULAR | Status: AC
  Administered 2012-02-16: 0.5 mg via INTRAVENOUS
  Filled 2012-02-16: qty 1

## 2012-02-16 MED ORDER — RIVAROXABAN 10 MG PO TABS
10.0000 mg | ORAL_TABLET | Freq: Every day | ORAL | Status: DC
  Administered 2012-02-17 – 2012-02-19 (×3): 10 mg via ORAL
  Filled 2012-02-16 (×3): qty 1

## 2012-02-16 MED ORDER — PROPOFOL 10 MG/ML IV EMUL
INTRAVENOUS | Status: DC | PRN
  Administered 2012-02-16: 50 ug/kg/min via INTRAVENOUS

## 2012-02-16 MED ORDER — DOCUSATE SODIUM 100 MG PO CAPS
100.0000 mg | ORAL_CAPSULE | Freq: Two times a day (BID) | ORAL | Status: DC
  Administered 2012-02-16 – 2012-02-19 (×6): 100 mg via ORAL
  Filled 2012-02-16 (×7): qty 1

## 2012-02-16 MED ORDER — CHLORHEXIDINE GLUCONATE 4 % EX LIQD: 60.0000 mL | Freq: Once | CUTANEOUS | Status: DC

## 2012-02-16 MED ORDER — METOCLOPRAMIDE HCL 5 MG/ML IJ SOLN: 5.0000 mg | Freq: Three times a day (TID) | INTRAMUSCULAR | Status: DC | PRN

## 2012-02-16 MED ORDER — ACETAMINOPHEN 325 MG PO TABS: 650.0000 mg | ORAL_TABLET | Freq: Four times a day (QID) | ORAL | Status: DC | PRN

## 2012-02-16 MED ORDER — FENTANYL CITRATE 0.05 MG/ML IJ SOLN
INTRAMUSCULAR | Status: AC
  Filled 2012-02-16: qty 2

## 2012-02-16 MED ORDER — ALBUTEROL SULFATE HFA 108 (90 BASE) MCG/ACT IN AERS
2.0000 | INHALATION_SPRAY | Freq: Four times a day (QID) | RESPIRATORY_TRACT | Status: DC | PRN
  Filled 2012-02-16: qty 6.7

## 2012-02-16 MED ORDER — DIPHENHYDRAMINE HCL 12.5 MG/5ML PO ELIX
12.5000 mg | ORAL_SOLUTION | ORAL | Status: DC | PRN
  Administered 2012-02-18: 25 mg via ORAL
  Filled 2012-02-16: qty 10

## 2012-02-16 MED ORDER — HYDROMORPHONE HCL PF 1 MG/ML IJ SOLN
0.5000 mg | INTRAMUSCULAR | Status: DC | PRN
  Administered 2012-02-16 (×2): 1 mg via INTRAVENOUS
  Administered 2012-02-16 (×2): 0.5 mg via INTRAVENOUS
  Filled 2012-02-16 (×3): qty 1

## 2012-02-16 MED ORDER — INDAPAMIDE 1.25 MG PO TABS
1.2500 mg | ORAL_TABLET | Freq: Every day | ORAL | Status: DC
  Administered 2012-02-17 – 2012-02-19 (×3): 1.25 mg via ORAL
  Filled 2012-02-16 (×3): qty 1

## 2012-02-16 MED ORDER — LACTATED RINGERS IV SOLN
INTRAVENOUS | Status: DC
  Administered 2012-02-16: 07:00:00 via INTRAVENOUS

## 2012-02-16 MED ORDER — PHENOL 1.4 % MT LIQD
1.0000 | OROMUCOSAL | Status: DC | PRN
  Administered 2012-02-17: 1 via OROMUCOSAL
  Filled 2012-02-16: qty 177

## 2012-02-16 MED ORDER — CLINDAMYCIN PHOSPHATE 600 MG/50ML IV SOLN
INTRAVENOUS | Status: AC
  Filled 2012-02-16: qty 50

## 2012-02-16 MED ORDER — CEFAZOLIN SODIUM-DEXTROSE 2-3 GM-% IV SOLR
INTRAVENOUS | Status: AC
  Filled 2012-02-16: qty 50

## 2012-02-16 MED ORDER — MIDAZOLAM HCL 5 MG/5ML IJ SOLN
INTRAMUSCULAR | Status: DC | PRN
  Administered 2012-02-16: 2 mg via INTRAVENOUS

## 2012-02-16 MED ORDER — POLYETHYLENE GLYCOL 3350 17 G PO PACK
17.0000 g | PACK | Freq: Every day | ORAL | Status: DC | PRN
  Filled 2012-02-16: qty 1

## 2012-02-16 MED ORDER — ACETAMINOPHEN 10 MG/ML IV SOLN
INTRAVENOUS | Status: AC
  Filled 2012-02-16: qty 100

## 2012-02-16 MED ORDER — BISACODYL 5 MG PO TBEC: 5.0000 mg | DELAYED_RELEASE_TABLET | Freq: Every day | ORAL | Status: DC | PRN

## 2012-02-16 MED ORDER — BUPIVACAINE-EPINEPHRINE 0.5% -1:200000 IJ SOLN
INTRAMUSCULAR | Status: DC | PRN
  Administered 2012-02-16: 15 mL

## 2012-02-16 MED ORDER — BUPIVACAINE IN DEXTROSE 0.75-8.25 % IT SOLN
INTRATHECAL | Status: DC | PRN
  Administered 2012-02-16: 2 mL via INTRATHECAL

## 2012-02-16 MED ORDER — HYDROCODONE-ACETAMINOPHEN 5-325 MG PO TABS
1.0000 | ORAL_TABLET | ORAL | Status: DC | PRN
  Administered 2012-02-16 (×3): 1 via ORAL
  Administered 2012-02-17 (×2): 2 via ORAL
  Filled 2012-02-16: qty 1
  Filled 2012-02-16 (×3): qty 2
  Filled 2012-02-16: qty 1

## 2012-02-16 MED ORDER — MENTHOL 3 MG MT LOZG
1.0000 | LOZENGE | OROMUCOSAL | Status: DC | PRN
  Filled 2012-02-16 (×2): qty 9

## 2012-02-16 MED ORDER — PROMETHAZINE HCL 25 MG/ML IJ SOLN: 6.2500 mg | INTRAMUSCULAR | Status: DC | PRN

## 2012-02-16 MED ORDER — LACTATED RINGERS IV SOLN: INTRAVENOUS | Status: DC

## 2012-02-16 MED ORDER — ONDANSETRON HCL 4 MG/2ML IJ SOLN
4.0000 mg | Freq: Four times a day (QID) | INTRAMUSCULAR | Status: DC | PRN
  Administered 2012-02-17: 4 mg via INTRAVENOUS
  Filled 2012-02-16: qty 2

## 2012-02-16 MED ORDER — AMLODIPINE BESYLATE 5 MG PO TABS
5.0000 mg | ORAL_TABLET | Freq: Once | ORAL | Status: AC
  Administered 2012-02-16: 5 mg via ORAL
  Filled 2012-02-16 (×2): qty 1

## 2012-02-16 MED ORDER — METHOCARBAMOL 500 MG PO TABS
500.0000 mg | ORAL_TABLET | Freq: Four times a day (QID) | ORAL | Status: DC | PRN
  Administered 2012-02-16 – 2012-02-19 (×6): 500 mg via ORAL
  Filled 2012-02-16 (×6): qty 1

## 2012-02-16 MED ORDER — SODIUM CHLORIDE 0.45 % IV SOLN
INTRAVENOUS | Status: DC
  Administered 2012-02-16 – 2012-02-17 (×2): via INTRAVENOUS

## 2012-02-16 MED ORDER — METOCLOPRAMIDE HCL 10 MG PO TABS: 5.0000 mg | ORAL_TABLET | Freq: Three times a day (TID) | ORAL | Status: DC | PRN

## 2012-02-16 MED ORDER — CLINDAMYCIN PHOSPHATE 600 MG/50ML IV SOLN: 600.0000 mg | Freq: Four times a day (QID) | INTRAVENOUS | Status: DC

## 2012-02-16 MED ORDER — LIDOCAINE HCL (CARDIAC) 20 MG/ML IV SOLN
INTRAVENOUS | Status: DC | PRN
  Administered 2012-02-16: 100 mg via INTRAVENOUS

## 2012-02-16 MED ORDER — CEFAZOLIN SODIUM-DEXTROSE 2-3 GM-% IV SOLR
2.0000 g | Freq: Four times a day (QID) | INTRAVENOUS | Status: AC
  Administered 2012-02-16 – 2012-02-17 (×3): 2 g via INTRAVENOUS
  Filled 2012-02-16 (×3): qty 50

## 2012-02-16 MED ORDER — PHENYLEPHRINE HCL 10 MG/ML IJ SOLN
20.0000 mg | INTRAVENOUS | Status: DC | PRN
  Administered 2012-02-16: 25 ug via INTRAVENOUS

## 2012-02-16 MED ORDER — ONDANSETRON HCL 4 MG PO TABS: 4.0000 mg | ORAL_TABLET | Freq: Four times a day (QID) | ORAL | Status: DC | PRN

## 2012-02-16 MED ORDER — LEVOTHYROXINE SODIUM 125 MCG PO TABS
125.0000 ug | ORAL_TABLET | Freq: Every day | ORAL | Status: DC
  Administered 2012-02-17 – 2012-02-19 (×3): 125 ug via ORAL
  Filled 2012-02-16 (×3): qty 1

## 2012-02-16 MED ORDER — ONDANSETRON HCL 4 MG/2ML IJ SOLN
INTRAMUSCULAR | Status: DC | PRN
  Administered 2012-02-16: 4 mg via INTRAVENOUS

## 2012-02-16 MED ORDER — PRAMIPEXOLE DIHYDROCHLORIDE 0.25 MG PO TABS
0.5000 mg | ORAL_TABLET | Freq: Three times a day (TID) | ORAL | Status: DC
  Administered 2012-02-16 – 2012-02-19 (×8): 0.5 mg via ORAL
  Filled 2012-02-16 (×10): qty 2

## 2012-02-16 MED ORDER — METHOCARBAMOL 100 MG/ML IJ SOLN
500.0000 mg | Freq: Four times a day (QID) | INTRAVENOUS | Status: DC | PRN
  Administered 2012-02-16 – 2012-02-17 (×2): 500 mg via INTRAVENOUS
  Filled 2012-02-16 (×4): qty 5

## 2012-02-16 MED ORDER — FENTANYL CITRATE 0.05 MG/ML IJ SOLN
25.0000 ug | INTRAMUSCULAR | Status: DC | PRN
  Administered 2012-02-16: 25 ug via INTRAVENOUS
  Administered 2012-02-16 (×2): 50 ug via INTRAVENOUS
  Administered 2012-02-16: 25 ug via INTRAVENOUS

## 2012-02-16 MED ORDER — EPHEDRINE SULFATE 50 MG/ML IJ SOLN
INTRAMUSCULAR | Status: DC | PRN
  Administered 2012-02-16: 5 mg via INTRAVENOUS
  Administered 2012-02-16: 10 mg via INTRAVENOUS

## 2012-02-16 MED ORDER — ACETAMINOPHEN 650 MG RE SUPP: 650.0000 mg | Freq: Four times a day (QID) | RECTAL | Status: DC | PRN

## 2012-02-16 MED ORDER — BUPIVACAINE-EPINEPHRINE (PF) 0.5% -1:200000 IJ SOLN
INTRAMUSCULAR | Status: AC
  Filled 2012-02-16: qty 10

## 2012-02-16 SURGICAL SUPPLY — 60 items
BAG ZIPLOCK 12X15 (MISCELLANEOUS) ×2 IMPLANT
BANDAGE ELASTIC 4 VELCRO ST LF (GAUZE/BANDAGES/DRESSINGS) ×2 IMPLANT
BANDAGE ELASTIC 6 VELCRO ST LF (GAUZE/BANDAGES/DRESSINGS) ×2 IMPLANT
BANDAGE ESMARK 6X9 LF (GAUZE/BANDAGES/DRESSINGS) ×1 IMPLANT
BLADE SAG 18X100X1.27 (BLADE) ×2 IMPLANT
BLADE SAW SGTL 13.0X1.19X90.0M (BLADE) ×2 IMPLANT
BNDG ESMARK 6X9 LF (GAUZE/BANDAGES/DRESSINGS) ×2
CEMENT HV SMART SET (Cement) ×2 IMPLANT
CHLORAPREP W/TINT 26ML (MISCELLANEOUS) IMPLANT
CLOTH BEACON ORANGE TIMEOUT ST (SAFETY) ×2 IMPLANT
CUFF TOURN SGL QUICK 34 (TOURNIQUET CUFF) ×1
CUFF TRNQT CYL 34X4X40X1 (TOURNIQUET CUFF) ×1 IMPLANT
DECANTER SPIKE VIAL GLASS SM (MISCELLANEOUS) ×2 IMPLANT
DRAPE LG THREE QUARTER DISP (DRAPES) ×2 IMPLANT
DRAPE ORTHO SPLIT 77X108 STRL (DRAPES) ×2
DRAPE POUCH INSTRU U-SHP 10X18 (DRAPES) ×2 IMPLANT
DRAPE SURG ORHT 6 SPLT 77X108 (DRAPES) ×2 IMPLANT
DRAPE U-SHAPE 47X51 STRL (DRAPES) ×2 IMPLANT
DRSG ADAPTIC 3X8 NADH LF (GAUZE/BANDAGES/DRESSINGS) ×2 IMPLANT
DRSG PAD ABDOMINAL 8X10 ST (GAUZE/BANDAGES/DRESSINGS) ×2 IMPLANT
DURAPREP 26ML APPLICATOR (WOUND CARE) ×2 IMPLANT
ELECT REM PT RETURN 9FT ADLT (ELECTROSURGICAL) ×2
ELECTRODE REM PT RTRN 9FT ADLT (ELECTROSURGICAL) ×1 IMPLANT
EVACUATOR 1/8 PVC DRAIN (DRAIN) ×2 IMPLANT
FACESHIELD LNG OPTICON STERILE (SAFETY) ×10 IMPLANT
GLOVE BIO SURGEON STRL SZ 6.5 (GLOVE) ×2 IMPLANT
GLOVE BIOGEL PI IND STRL 8 (GLOVE) ×1 IMPLANT
GLOVE BIOGEL PI INDICATOR 8 (GLOVE) ×1
GLOVE ECLIPSE 6.5 STRL STRAW (GLOVE) ×2 IMPLANT
GLOVE INDICATOR 6.5 STRL GRN (GLOVE) ×2 IMPLANT
GLOVE SURG SS PI 8.0 STRL IVOR (GLOVE) ×4 IMPLANT
GOWN PREVENTION PLUS LG XLONG (DISPOSABLE) ×2 IMPLANT
GOWN PREVENTION PLUS XLARGE (GOWN DISPOSABLE) ×2 IMPLANT
GOWN STRL REIN XL XLG (GOWN DISPOSABLE) ×2 IMPLANT
HANDPIECE INTERPULSE COAX TIP (DISPOSABLE) ×1
IMMOBILIZER KNEE 20 (SOFTGOODS) ×2
IMMOBILIZER KNEE 20 THIGH 36 (SOFTGOODS) ×1 IMPLANT
KIT BASIN OR (CUSTOM PROCEDURE TRAY) ×2 IMPLANT
MANIFOLD NEPTUNE II (INSTRUMENTS) ×2 IMPLANT
NS IRRIG 1000ML POUR BTL (IV SOLUTION) ×2 IMPLANT
PACK TOTAL JOINT (CUSTOM PROCEDURE TRAY) ×2 IMPLANT
PAD ABD 7.5X8 STRL (GAUZE/BANDAGES/DRESSINGS) ×2 IMPLANT
PADDING CAST COTTON 6X4 STRL (CAST SUPPLIES) ×2 IMPLANT
POSITIONER SURGICAL ARM (MISCELLANEOUS) ×2 IMPLANT
SET HNDPC FAN SPRY TIP SCT (DISPOSABLE) ×1 IMPLANT
SPONGE GAUZE 4X4 12PLY (GAUZE/BANDAGES/DRESSINGS) ×2 IMPLANT
SPONGE SURGIFOAM ABS GEL 100 (HEMOSTASIS) ×2 IMPLANT
STAPLER VISISTAT (STAPLE) ×2 IMPLANT
SUCTION FRAZIER 12FR DISP (SUCTIONS) ×2 IMPLANT
SUT BONE WAX W31G (SUTURE) ×2 IMPLANT
SUT VIC AB 1 CT1 27 (SUTURE) ×4
SUT VIC AB 1 CT1 27XBRD ANTBC (SUTURE) ×4 IMPLANT
SUT VIC AB 2-0 CT1 27 (SUTURE) ×3
SUT VIC AB 2-0 CT1 TAPERPNT 27 (SUTURE) ×3 IMPLANT
SUT VLOC 180 0 24IN GS25 (SUTURE) ×4 IMPLANT
SYR 30ML LL (SYRINGE) ×2 IMPLANT
TOWER CARTRIDGE SMART MIX (DISPOSABLE) ×2 IMPLANT
TRAY FOLEY CATH 14FRSI W/METER (CATHETERS) ×2 IMPLANT
WATER STERILE IRR 1500ML POUR (IV SOLUTION) ×2 IMPLANT
WRAP KNEE MAXI GEL POST OP (GAUZE/BANDAGES/DRESSINGS) ×2 IMPLANT

## 2012-02-16 NOTE — Anesthesia Postprocedure Evaluation (Signed)
Anesthesia Post Note  Patient: Monique Hudson  Procedure(s) Performed: Procedure(s) (LRB): TOTAL KNEE ARTHROPLASTY (Right)  Anesthesia type: Spinal  Patient location: PACU  Post pain: Pain level controlled  Post assessment: Post-op Vital signs reviewed  Last Vitals:  Filed Vitals:   02/16/12 1015  BP:   Pulse:   Temp: 36.4 C  Resp:     Post vital signs: Reviewed  Level of consciousness: sedated  Complications: No apparent anesthesia complications

## 2012-02-16 NOTE — Anesthesia Procedure Notes (Signed)
Spinal  Patient location during procedure: OR Start time: 02/16/2012 7:40 AM End time: 02/16/2012 7:45 AM Staffing Anesthesiologist: Lucille Passy F Performed by: anesthesiologist  Preanesthetic Checklist Completed: patient identified, site marked, surgical consent, pre-op evaluation, timeout performed, IV checked, risks and benefits discussed and monitors and equipment checked Spinal Block Patient position: sitting Prep: Betadine Patient monitoring: heart rate, continuous pulse ox and blood pressure Approach: midline Injection technique: single-shot Needle Needle type: Quincke  Needle gauge: 22 G Needle length: 9 cm Assessment Sensory level: T6 Additional Notes Expiration date of kit checked and confirmed. Patient tolerated procedure well, without complications.

## 2012-02-16 NOTE — Preoperative (Signed)
Beta Blockers   Reason not to administer Beta Blockers:Not Applicable 

## 2012-02-16 NOTE — Brief Op Note (Signed)
02/16/2012  9:29 AM  PATIENT:  Monique Hudson  77 y.o. female  PRE-OPERATIVE DIAGNOSIS:  osteoarthritis right knee  POST-OPERATIVE DIAGNOSIS:  Osteoarthritis Right Knee  PROCEDURE:  Procedure(s) (LRB): TOTAL KNEE ARTHROPLASTY (Right)  SURGEON:  Surgeon(s) and Role:    * Javier Docker, MD - Primary  PHYSICIAN ASSISTANT:   ASSISTANTS: strader  ANESTHESIA:   general  EBL:  Total I/O In: 1000 [I.V.:1000] Out: 160 [Urine:150; Blood:10]  BLOOD ADMINISTERED:none  DRAINS: (1) Hemovact drain(s) in the knee with  Suction Open   LOCAL MEDICATIONS USED:  MARCAINE     SPECIMEN:  No Specimen  DISPOSITION OF SPECIMEN:  N/A  COUNTS:  YES  TOURNIQUET:  * Missing tourniquet times found for documented tourniquets in log:  [id number] *  DICTATION: .Other Dictation: Dictation Number [id number]  PLAN OF CARE: Admit to inpatient   PATIENT DISPOSITION:  PACU - hemodynamically stable.   Delay start of Pharmacological VTE agent (>24hrs) due to surgical blood loss or risk of bleeding: no

## 2012-02-16 NOTE — Anesthesia Preprocedure Evaluation (Addendum)
Anesthesia Evaluation  Patient identified by MRN, date of birth, ID band Patient awake    Reviewed: Allergy & Precautions, H&P , NPO status , Patient's Chart, lab work & pertinent test results  History of Anesthesia Complications (+) AWARENESS UNDER ANESTHESIANegative for: history of anesthetic complications  Airway Mallampati: II TM Distance: >3 FB Neck ROM: Full    Dental  (+) Edentulous Upper and Edentulous Lower   Pulmonary shortness of breath, asthma , sleep apnea (noncompiant with CPAP) ,  breath sounds clear to auscultation  Pulmonary exam normal       Cardiovascular hypertension, negative cardio ROS  + Valvular Problems/Murmurs Rhythm:Regular Rate:Normal     Neuro/Psych Anxiety negative neurological ROS     GI/Hepatic negative GI ROS, Neg liver ROS,   Endo/Other  Hypothyroidism Morbid obesity  Renal/GU negative Renal ROS  negative genitourinary   Musculoskeletal negative musculoskeletal ROS (+)   Abdominal   Peds negative pediatric ROS (+)  Hematology negative hematology ROS (+)   Anesthesia Other Findings   Reproductive/Obstetrics negative OB ROS                          Anesthesia Physical Anesthesia Plan  ASA: III  Anesthesia Plan: Spinal   Post-op Pain Management:    Induction:   Airway Management Planned: Simple Face Mask  Additional Equipment:   Intra-op Plan:   Post-operative Plan:   Informed Consent:   Dental advisory given  Plan Discussed with: CRNA  Anesthesia Plan Comments:         Anesthesia Quick Evaluation

## 2012-02-16 NOTE — Transfer of Care (Signed)
Immediate Anesthesia Transfer of Care Note  Patient: Monique Hudson  Procedure(s) Performed: Procedure(s) (LRB): TOTAL KNEE ARTHROPLASTY (Right)  Patient Location: PACU  Anesthesia Type: MAC and Spinal  Level of Consciousness: awake, alert , oriented and patient cooperative  Airway & Oxygen Therapy: Patient Spontanous Breathing and Patient connected to face mask oxygen  Post-op Assessment: Report given to PACU RN and Post -op Vital signs reviewed and stable  Post vital signs: Reviewed and stable  Complications: No apparent anesthesia complications

## 2012-02-16 NOTE — Interval H&P Note (Signed)
History and Physical Interval Note:  02/16/2012 7:28 AM  Monique Hudson  has presented today for surgery, with the diagnosis of osteoarthritis right knee  The various methods of treatment have been discussed with the patient and family. After consideration of risks, benefits and other options for treatment, the patient has consented to  Procedure(s) (LRB): TOTAL KNEE ARTHROPLASTY (Right) as a surgical intervention .  The patients' history has been reviewed, patient examined, no change in status, stable for surgery.  I have reviewed the patients' chart and labs.  Questions were answered to the patient's satisfaction.     Zyheir Daft C

## 2012-02-16 NOTE — Discharge Instructions (Signed)
Arthroscopic Procedure, Knee Care After Refer to this sheet in the next few weeks. These discharge instructions provide you with general information on caring for yourself after you leave the hospital. Your caregiver may also give you specific instructions. Your treatment has been planned according to the most current medical practices available, but unavoidable complications sometimes occur. If you have any problems or questions after discharge, please call your caregiver. HOME CARE INSTRUCTIONS  It will be normal to be sore for a couple days after surgery. See your caregiver if this seems to be getting worse rather than better. Only take over-the-counter or prescription medicines for pain, discomfort, or fever as directed by your caregiver.  Take showers rather than baths, or as directed by your caregiver.   Change bandages (dressings) if necessary or as directed.   You may resume normal diet and activities as directed or allowed.   Avoid lifting or driving until you are directed otherwise.   Make an appointment to see your caregiver for stitches (suture) or staple removal as directed.   You may put ice on the area.   Put ice in a plastic bag.   Place a towel between your skin and the bag.   Leave the ice on for 15 to 20 minutes, 3 to 4 times per day for the first 2 days.  SEEK MEDICAL CARE IF:   You have increased bleeding from your wounds.   You see redness, swelling, or have increasing pain in your wounds.   You have pus coming from your wound.   You have an oral temperature above 102 F (38.9 C).   You notice a bad smell coming from the wound or dressing.   You have severe pain with any motion of your knee.  SEEK IMMEDIATE MEDICAL CARE IF:   You develop a rash.   You have difficulty breathing   You develop any reaction or side effects to medicines taken.  MAKE SURE YOU:   Understand these instructions.   Will watch your condition.   Will get help right away if  you are not doing well or get worse.  Document Released: 05/13/2005 Document Revised: 10/13/2011 Document Reviewed: 01/17/2008 Exit[employer] Patient Information 2012 ExitCare, LLCMeniscus Injury of the Knee, Arthroscopy You may have an internal derangement of the knee. This means something is wrong inside the knee. Your caregiver can make a more accurate diagnosis (learning what is wrong) by performing an arthroscopic procedure. Your knee has two layers of cartilage. Articular cartilage covers the bone ends. It lets your knee bend and move smoothly. Two menisci (thick pads of cartilage that form a rim inside the joint) help absorb shock. They stabilize your knee. Ligaments bind the bones together. They support your knee joint. Muscles move the joint, help support your knee, and take stress off the joint itself.  ABOUT THE PROCEDURE Arthroscopy is a surgical technique. It allows your orthopedic surgeon to diagnose and treat your knee injury with accuracy. The surgeon looks into your knee through a small scope. The scope is like a small (pencil-sized) telescope. Arthroscopy is less invasive than open knee surgery. You can expect a more rapid recovery. Following your caregiver's instructions will help you recover rapidly and completely. Use crutches, rest, elevate, ice, and do knee exercises as instructed. The length of recovery depends on various factors. These factors include type of injury, age, physical condition, medical conditions, and your determination. How long you will be away from your normal activities will depend on what kind  of knee problem you have. It will also depend on how much tissue is damaged. Rebuilding your muscles after arthroscopy helps ensure a full recovery. RECOVERY Recovery after a meniscus injury depends on how much meniscus is damaged. It also depends on whether or not you have damaged other knee tissue. With small tears, your recovery may take a couple weeks. Larger tears will take  longer. Meniscus injuries can usually be treated during arthroscopy. If your injury is on the inner edge of the meniscus, your surgeon may trim the meniscus back to a smooth rim. In other cases, your surgeon will try to repair a damaged meniscus with sutures (stitches). This may lengthen your rehabilitation. It may provide better long-term health by helping your knee retain its shock absorption abilities. Use crutches, limit weight bearing, rest, elevate, apply ice, and exercise your knee as instructed. If a brace is applied, use as directed. The length of recovery depends on various factors including type of injury, age, physical condition, other medical conditions, and your determination. Your caregiver will help with instructions for rehabilitation of your knee. HOME CARE INSTRUCTIONS  Use crutches and knee exercises as instructed.   Applying an ice pack to your operative site may help with discomfort. It may also keep the swelling down.   Only take over-the-counter or prescription medicines for pain, discomfort, inflammation (soreness)or fever as directed by your caregiver. You may use these only if your caregiver has not given medications that would interfere.   You may resume normal diet and activities as directed.  SEEK MEDICAL ATTENTION IF:  There is increased bleeding (more than a small spot) from the wound.   You notice redness, swelling, or increasing pain in the wound.   Pus is coming from wound.   An unexplained oral temperature above 102 F (38.9 C) develops, or as your caregiver suggests.   You notice a foul smell coming from the wound or dressing.  SEEK IMMEDIATE MEDICAL CARE IF:  You develop a rash.   You have difficulty breathing.   You have any allergic problems.  Document Released: 10/21/2000 Document Revised: 10/13/2011 Document Reviewed: 01/07/2008 [facility] Patient Information 2012 [place], [place].Marland Kitchen

## 2012-02-17 LAB — URINALYSIS, ROUTINE W REFLEX MICROSCOPIC
Bilirubin Urine: NEGATIVE
Ketones, ur: NEGATIVE mg/dL
Nitrite: NEGATIVE
Specific Gravity, Urine: 1.025 (ref 1.005–1.030)
Urobilinogen, UA: 0.2 mg/dL (ref 0.0–1.0)
pH: 6 (ref 5.0–8.0)

## 2012-02-17 LAB — BASIC METABOLIC PANEL
BUN: 17 mg/dL (ref 6–23)
CO2: 28 mEq/L (ref 19–32)
Calcium: 8.4 mg/dL (ref 8.4–10.5)
Chloride: 94 mEq/L — ABNORMAL LOW (ref 96–112)
Creatinine, Ser: 0.81 mg/dL (ref 0.50–1.10)
Glucose, Bld: 131 mg/dL — ABNORMAL HIGH (ref 70–99)

## 2012-02-17 LAB — CBC
HCT: 34.5 % — ABNORMAL LOW (ref 36.0–46.0)
MCH: 29.6 pg (ref 26.0–34.0)
MCV: 91 fL (ref 78.0–100.0)
RBC: 3.79 MIL/uL — ABNORMAL LOW (ref 3.87–5.11)
WBC: 7.9 10*3/uL (ref 4.0–10.5)

## 2012-02-17 LAB — URINE MICROSCOPIC-ADD ON

## 2012-02-17 MED ORDER — HYDROCODONE-ACETAMINOPHEN 7.5-325 MG PO TABS
1.0000 | ORAL_TABLET | ORAL | Status: DC
  Administered 2012-02-17: 2 via ORAL
  Administered 2012-02-17: 1 via ORAL
  Administered 2012-02-17 (×2): 2 via ORAL
  Administered 2012-02-18: 1 via ORAL
  Administered 2012-02-18 – 2012-02-19 (×5): 2 via ORAL
  Filled 2012-02-17 (×5): qty 2
  Filled 2012-02-17: qty 1
  Filled 2012-02-17 (×4): qty 2
  Filled 2012-02-17: qty 1
  Filled 2012-02-17: qty 2

## 2012-02-17 NOTE — Progress Notes (Signed)
CARE MANAGEMENT NOTE 02/17/2012  Patient:  Monique Hudson, Monique Hudson   Account Number:  [id number]  Date Initiated:  02/17/2012  Documentation initiated by:  Colleen Can  Subjective/Objective Assessment:   DX osteoaarthritis right knee; total knee replacemnt     Action/Plan:   CM spoke with patient' Plans are for patient to return to her home in [place] where her spouse will be caregiver, She already has DME=RW, 3n1, reacher. choice offered. Spouse requested Monique Hudson used in past and wants same PT   Anticipated DC Date:  02/19/2012   Anticipated DC Plan:  HOME W HOME HEALTH SERVICES  In-house referral  Clinical Social Worker      DC Associate Professor  CM consult      [facility] Choice  HOME HEALTH   Choice offered to / List presented to:  C-1 Patient   DME arranged  NA      DME agency  NA     HH arranged  HH-2 PT      [facility] agency  [employer]   Status of service:  In process, will continue to follow Comments:  02/17/2012 Monique Hudson bsn ccm [phone] List of [facility] agencies placed in shadow chart. Monique Hudson will provide services day after discharge from hospital.

## 2012-02-17 NOTE — Progress Notes (Signed)
Subjective: 1 Day Post-Op Procedure(s) (LRB): TOTAL KNEE ARTHROPLASTY (Right) Patient reports pain as 4 on 0-10 scale.  Denies CP or SOB  Patient has complaints of knee pain as expected but overall is feeling well  We will start therapy today. Plan is to go home after hospital stay.  Objective: Vital signs in last 24 hours: Temp:  [97.4 F (36.3 C)-99.3 F (37.4 C)] 99.3 F (37.4 C) (04/12 0449) Pulse Rate:  [56-77] 70  (04/12 0449) Resp:  [12-21] 14  (04/12 0449) BP: (102-159)/(49-103) 146/65 mmHg (04/12 0449) SpO2:  [95 %-100 %] 95 % (04/12 0449) Weight:  [99.791 kg (220 lb)] 99.791 kg (220 lb) (04/11 1620)  Intake/Output from previous day:  Intake/Output Summary (Last 24 hours) at 02/17/12 0840 Last data filed at 02/17/12 0449  Gross per 24 hour  Intake   2530 ml  Output   1310 ml  Net   1220 ml    Intake/Output this shift:    Labs: Results for orders placed during the hospital encounter of 02/16/12  TYPE AND SCREEN      Component Value Range   ABO/RH(D) O POS     Antibody Screen NEG     Sample Expiration 02/19/2012    CBC      Component Value Range   WBC 7.9  4.0 - 10.5 (K/uL)   RBC 3.79 (*) 3.87 - 5.11 (MIL/uL)   Hemoglobin 11.2 (*) 12.0 - 15.0 (g/dL)   HCT 16.1 (*) 09.6 - 46.0 (%)   MCV 91.0  78.0 - 100.0 (fL)   MCH 29.6  26.0 - 34.0 (pg)   MCHC 32.5  30.0 - 36.0 (g/dL)   RDW 04.5  40.9 - 81.1 (%)   Platelets 167  150 - 400 (K/uL)  BASIC METABOLIC PANEL      Component Value Range   Sodium 130 (*) 135 - 145 (mEq/L)   Potassium 5.0  3.5 - 5.1 (mEq/L)   Chloride 94 (*) 96 - 112 (mEq/L)   CO2 28  19 - 32 (mEq/L)   Glucose, Bld 131 (*) 70 - 99 (mg/dL)   BUN 17  6 - 23 (mg/dL)   Creatinine, Ser 9.14  0.50 - 1.10 (mg/dL)   Calcium 8.4  8.4 - 78.2 (mg/dL)   GFR calc non Af Amer 70 (*) >90 (mL/min)   GFR calc Af Amer 81 (*) >90 (mL/min)    Exam - Neurologically intact Neurovascular intact Sensation intact distally Dorsiflexion/Plantar flexion  intact Incision: dressing C/D/I Compartment soft Dressing - clean, dry Motor function intact - moving foot and toes well on exam.  Hemovac pulled without difficulty.  Assessment/Plan: 1 Day Post-Op Procedure(s) (LRB): TOTAL KNEE ARTHROPLASTY (Right)  Advance diet Up with therapy Past Medical History  Diagnosis Date  . Asthma   . Hypertension   . Heart murmur   . Restless leg syndrome   . Hypothyroidism   . Arthritis     PAIN AND ARTHRITITS RIGHT KNEE  . Shortness of breath     WITH EXERTION  . Bronchitis     HX OF CHRONIC BRONCHITIS--NO RECENT PROBLEMS  . Sleep apnea     NOT USING CPAP - IS USING OXYGEN 2 L PER NASAL CANNULA WHEN SLEEPING  . Claustrophobia   . Complication of anesthesia     PT REPORTS PROBLEMS WITH HER OXYGEN SATURATION AFTER ONE OF HER PAST SURGERIES.  PT STATES SPINAL FOR MOST RECENT LEFT TKA ON 08/12/11 AT [facility] AND NO PROBLEMS.  DVT Prophylaxis - Xarelto  Protocol Weight-Bearing as tolerated to right leg Will lower IV fluids monitor Na Dressing change tomorrow  Will increase Norco to 7.5 Plan for d/c home on Monday No vaccines.  Courtland Coppa R. 02/17/2012, 8:40 AM

## 2012-02-17 NOTE — Op Note (Signed)
NAMESITA, MANGEN             ACCOUNT NO.:  [id number]  MEDICAL RECORD NO.:  [id number]  LOCATION:  1610                         FACILITY:  [facility]  PHYSICIAN:  Jene Every, M.D.    DATE OF BIRTH:  1935-05-02  DATE OF PROCEDURE:  02/16/2012 DATE OF DISCHARGE:                              OPERATIVE REPORT   PREOPERATIVE DIAGNOSIS:  End-stage osteoarthrosis of the right knee valgus deformity.  POSTOPERATIVE DIAGNOSIS:  End-stage osteoarthrosis of the right knee valgus deformity.  PROCEDURE PERFORMED:  Right total knee arthroplasty.  COMPONENTS:  DePuy rotating platform, 3 femur, 3 tibia, 10 mm insert, 35 patella.  ANESTHESIA:  General.  ASSISTANT:  Roma Schanz, P.A.  HISTORY:  A 77 year old with end-stage osteoarthrosis refractory, indicated  for replacement degenerated joint.  Risks and benefits were discussed including bleeding, infection, suboptimal range of motion, DVT, PE, anesthetic complications, etc.  TECHNIQUE:  With the patient in supine position, after induction of adequate general anesthesia, 2 g Kefzol 600 of clindamycin.  The right lower extremity was prepped, draped, and exsanguinated in usual sterile fashion with thigh tourniquet inflated to 200 mmHg.  A midline incision was made with the knee flexed.  Full-thickness flaps and median parapatellar arthrotomy performed.  Patella everted and knee flexed, slight elevation of superficial tissues medially.  Tricompartmental osteoporosis was noted.  Medial and lateral menisci remnants were removed as was the ACL.  Osteophytes removed.  Step drill was utilized to enter and femoral canal was irrigated.  A 3-degree right placed, 10 to  11 off the distal femur due to flexion contracture. Distal femoral cut applied.  A sizing guide applied off the anterior cortex, measured to a 3, pinned.  Anterior, posterior and chamfer cuts performed.  Soft tissues protected at all times posteriorly.  Attention towards  the tibia, subluxed Mchale retractors, external alignment guide parallel to the tibia bisecting the ankle, 2 off the defect which was medial, pinned.  Oscillating saw performed a proximal tibial cut.  We checked the flexion-extension gaps and they were equivalent, utilizing a 10 mm spacer.  We turned our attention then towards completing the tibia, maximized the surface with a 3, just to the medial portion of the tibial tubercle.  Essentially drilled, punch guide applied completed the femur. The box cut was performed bisecting the canal.  We then placed a trial femur, 10 mm insert, full extension, full flexion, good stability with varus valgus stressing.  Turned our attention towards the patella.  It measured 24.  We cut the patella to a 15 utilizing a jig, drilled our peg holes, placed a trial patella.  We had a good patellofemoral tracking.  All trials were then removed.  We checked posteriorly, removed any remnants and osteophytes, preserved the popliteus.  No residual debris.  Capsule intact.  Copiously irrigated by pulsatile lavage.  Flexed the knee, all surface was dried.  Mixed the cement in the back table in appropriate fashion and injected it into the proximal tibia under digital pressure. Impacted a 3 tibial tray and impacted a 3 femur.  Redundant cement removed.  A 10 mm insert applied and reduced.  Axial load applied throughout  curing of the cement.  Cemented the  patellar component with a clamp.  After curing of the cement, we had a good flexion and extension, good stability of varus valgus stressing 0-30 degrees, selected the 10. Removed the trial and checked posteriorly, removed redundant cement.  Copiously irrigated the wound, placed a 10 permanent rotating platform insert and again full extension, flexion, and good stability of varus valgus stressing 0-30 degrees, and good patellofemoral tracking.  Placed a Hemovac and brought out through a lateral stab wound in the  skin.  We had also cauterized the geniculate posteriorly prior to insertion of our components.  We repaired the patellar arthrotomy with a serial V-Loc and over sewed it with #1 Vicryl in interrupted figure-of-8 sutures, subcu with 2-0, and skin with subcuticular suture.  Wound reinforced with Steri-Strips.  Sterile dressing applied.  She had flexion to gravity against 90 degrees. Negative anterior drawer.  Good stability of varus valgus stressing 0-30 degrees and normal patellofemoral tracking.  After tourniquet was deflated, there was adequate revascularization in lower extremity appreciated.  The patient tolerated the procedure well with no complication.     Jene Every, M.D.     Cordelia Pen  D:  02/16/2012  T:  02/17/2012  Job:  [id number]

## 2012-02-17 NOTE — Evaluation (Signed)
Physical Therapy Evaluation Patient Details Name: Monique Hudson MRN: [id number] DOB: June 11, 1935 Today's Date: 02/17/2012  Problem List:  Patient Active Problem List  Diagnoses  . HYPOTHYROIDISM  . HYPERTENSION  . URI  . HIP PAIN, RIGHT  . UNEQUAL LEG LENGTH    Past Medical History:  Past Medical History  Diagnosis Date  . Asthma   . Hypertension   . Heart murmur   . Restless leg syndrome   . Hypothyroidism   . Arthritis     PAIN AND ARTHRITITS RIGHT KNEE  . Shortness of breath     WITH EXERTION  . Bronchitis     HX OF CHRONIC BRONCHITIS--NO RECENT PROBLEMS  . Sleep apnea     NOT USING CPAP - IS USING OXYGEN 2 L PER NASAL CANNULA WHEN SLEEPING  . Claustrophobia   . Complication of anesthesia     PT REPORTS PROBLEMS WITH HER OXYGEN SATURATION AFTER ONE OF HER PAST SURGERIES.  PT STATES SPINAL FOR MOST RECENT LEFT TKA ON 08/12/11 AT [facility] AND NO PROBLEMS.   Past Surgical History:  Past Surgical History  Procedure Date  . Joint replacement 08/12/2011    LEFT TOTAL KNEE ARTHROPLASTY  . Eye surgery 2011    BILATERAL CATARACT EXTRACTION  . Abdominal hysterectomy 2000  . Lipomas removed from arms and abdomen   . Breast surgery 1982     CYST REMOVED LEFT BREAST  . Resection of ovaries 1964    FOR OVULATION PURPOSE  . Appendectomy   . Cardiac catheterization     APPROXIMATELY 6 YRS AGO--TOLD MINIMAL BLOCKAGE-DONE AT [facility]  . Brain surgery 2006    TEMPORAL LOBECTOMY FOR RIGHT TEMPORAL ENCEPHALOCELE - PER OFFICE NOTES [facility]    PT Assessment/Plan/Recommendation PT Assessment Clinical Impression Statement: pt is s/p RTKA, limited by pain. pt will benefit from PT to improve in ROM, strength, mobility to DC to home. pt's spouse is present and is excellent caregiver. PT Recommendation Follow Up Recommendations: Home health PT Equipment Recommended: None recommended by PT PT Goals  Acute Rehab PT Goals PT Goal Formulation: With  patient/family Time For Goal Achievement: 7 days Pt will go Supine/Side to Sit: with supervision;with HOB 0 degrees PT Goal: Supine/Side to Sit - Progress: Goal set today Pt will go Sit to Supine/Side: with supervision;with HOB 0 degrees PT Goal: Sit to Supine/Side - Progress: Goal set today Pt will go Sit to Stand: with supervision PT Goal: Sit to Stand - Progress: Goal set today Pt will go Stand to Sit: with supervision PT Goal: Stand to Sit - Progress: Goal set today Pt will Ambulate: 16 - 50 feet;with supervision;with rolling walker PT Goal: Ambulate - Progress: Goal set today Pt will Go Up / Down Stairs: 1-2 stairs;with min assist;with least restrictive assistive device PT Goal: Up/Down Stairs - Progress: Goal set today Pt will Perform Home Exercise Program: with supervision, verbal cues required/provided PT Goal: Perform Home Exercise Program - Progress: Goal set today  PT Evaluation Precautions/Restrictions  Precautions Precautions: Knee Required Braces or Orthoses: Knee Immobilizer - Right Knee Immobilizer - Right: Discontinue once straight leg raise with < 10 degree lag Restrictions Weight Bearing Restrictions: No Prior Functioning  Home Living Lives With: Spouse Available Help at Discharge: Available 24 hours/day;Family Type of Home: House Home Access: Stairs to enter Secretary/administrator of Steps: 2 Entrance Stairs-Rails: Right;Left Home Layout: One level Home Adaptive Equipment: Crutches Prior Function Level of Independence: Independent Able to Take Stairs?: Yes Driving: No  Vocation: Retired Financial risk analyst Arousal/Alertness: Awake/alert Overall Cognitive Status: Appears within functional limits for tasks assessed Orientation Level: Oriented X4 Sensation/Coordination Sensation Light Touch: Appears Intact Extremity Assessment RLE Assessment RLE Assessment: Exceptions to [facility] RLE AROM (degrees) RLE Overall AROM Comments: pt only tolerated about 30  degrees of flexion.  RLE Strength RLE Overall Strength Comments: pt requires assist for SLR LLE Assessment LLE Assessment: Within Functional Limits Mobility (including Balance) Bed Mobility Bed Mobility: Yes Supine to Sit: 1: +2 Total assist;With rails;HOB elevated (Comment degrees) Supine to Sit Details (indicate cue type and reason): pt=40%, HOB @45  Transfers Transfers: Yes Sit to Stand: 1: +2 Total assist;From bed;From elevated surface;With upper extremity assist Sit to Stand Details (indicate cue type and reason): vc to try to push from bed/RW Stand to Sit: 1: +2 Total assist;To chair/3-in-1;With upper extremity assist Stand to Sit Details: vc to take care not to slide off of recliner due to pt's height, pt=60% Stand Pivot Transfers: 1: +2 Total assist Stand Pivot Transfer Details (indicate cue type and reason): pt=60%  , vc for attempting to bare weight on RLE, assistance for standing balance. Ambulation/Gait Ambulation/Gait: No    Exercise  Total Joint Exercises Ankle Circles/Pumps: AROM;Both;Supine Quad Sets: AROM;Right;10 reps;Supine Heel Slides: AAROM;Right;5 reps;Supine Hip ABduction/ADduction: AAROM;Right;10 reps;Supine Straight Leg Raises: AAROM;Right;5 reps;Supine End of Session PT - End of Session Equipment Utilized During Treatment: Right knee immobilizer Activity Tolerance: Patient limited by pain Patient left: in chair;with call bell in reach;with family/visitor present Nurse Communication: Mobility status for transfers General Behavior During Session: [facility] for tasks performed Cognition: [facility] for tasks performed Rada Hay 02/17/2012, 3:29 PM

## 2012-02-17 NOTE — Clinical Documentation Improvement (Signed)
BMI DOCUMENTATION CLARIFICATION QUERY  THIS DOCUMENT IS NOT A PERMANENT PART OF THE MEDICAL RECORD  TO RESPOND TO THE THIS QUERY, FOLLOW THE INSTRUCTIONS BELOW:  1. If needed, update documentation for the patient's encounter via the notes activity.  2. Access this query again and click edit on the In [employer].  3. After updating, or not, click F2 to complete all highlighted (required) fields concerning your review. Select "additional documentation in the medical record" OR "no additional documentation provided".  4. Click Sign note button.  5. The deficiency will fall out of your In Basket *Please let us know if you are not able to complete this workflow by phone or e-mail (listed below).         02/17/12  Dear Dr. Shelle Iron, Lelan Pons  In an effort to better capture your patient's severity of illness, reflect appropriate length of stay and utilization of resources, a review of the patient medical record has revealed the following indicators.    Based on your clinical judgment, please clarify and document in a progress note and/or discharge summary the clinical condition associated with the following supporting information:  In responding to this query please exercise your independent judgment.  The fact that a query is asked, does not imply that any particular answer is desired or expected.  Pt with End Stage arthritis, right knee  Pt's BMI=  43.1 in setting of severe arthritis to right knee   Please clarify whether or not BMI can be linked to one of he diagnoses listed below and document in pn  and d/c. Thank You!  [facility]ACT[facility]: When linking BMI to a diagnosis please document both BMI and diagnosis together in pn for accuracy of SOI and ROM.    Possible Clinical conditions  Morbid Obesity W/ BMI= yes  Underweight w/BMI=  Other condition___________________  Cannot Clinically determine _____________  Risk Factors: Sign &  Symptoms: BMI-43.1 5'/220lbs   Treatment Monitoring  Reviewed: additional documentation in the medical record  Thank You,  Enis Slipper  RN, BSN, CCDS Clinical Documentation Specialist [facility] HIM Dept Pager: [phone] / E-mail: [email or web address]  Health Information Management [place]

## 2012-02-18 LAB — CBC
Hemoglobin: 10.7 g/dL — ABNORMAL LOW (ref 12.0–15.0)
MCH: 29.3 pg (ref 26.0–34.0)
MCHC: 32.9 g/dL (ref 30.0–36.0)
MCV: 89 fL (ref 78.0–100.0)
RBC: 3.65 MIL/uL — ABNORMAL LOW (ref 3.87–5.11)

## 2012-02-18 LAB — URINE CULTURE
Colony Count: 45000
Culture  Setup Time: 201304111352

## 2012-02-18 NOTE — Evaluation (Signed)
Occupational Therapy Evaluation Patient Details Name: Monique Hudson MRN: [id number] DOB: June 30, 1935 Today's Date: 02/18/2012  Problem List:  Patient Active Problem List  Diagnoses  . HYPOTHYROIDISM  . HYPERTENSION  . URI  . HIP PAIN, RIGHT  . UNEQUAL LEG LENGTH    Past Medical History:  Past Medical History  Diagnosis Date  . Asthma   . Hypertension   . Heart murmur   . Restless leg syndrome   . Hypothyroidism   . Arthritis     PAIN AND ARTHRITITS RIGHT KNEE  . Shortness of breath     WITH EXERTION  . Bronchitis     HX OF CHRONIC BRONCHITIS--NO RECENT PROBLEMS  . Sleep apnea     NOT USING CPAP - IS USING OXYGEN 2 L PER NASAL CANNULA WHEN SLEEPING  . Claustrophobia   . Complication of anesthesia     PT REPORTS PROBLEMS WITH HER OXYGEN SATURATION AFTER ONE OF HER PAST SURGERIES.  PT STATES SPINAL FOR MOST RECENT LEFT TKA ON 08/12/11 AT [facility] AND NO PROBLEMS.   Past Surgical History:  Past Surgical History  Procedure Date  . Joint replacement 08/12/2011    LEFT TOTAL KNEE ARTHROPLASTY  . Eye surgery 2011    BILATERAL CATARACT EXTRACTION  . Abdominal hysterectomy 2000  . Lipomas removed from arms and abdomen   . Breast surgery 1982     CYST REMOVED LEFT BREAST  . Resection of ovaries 1964    FOR OVULATION PURPOSE  . Appendectomy   . Cardiac catheterization     APPROXIMATELY 6 YRS AGO--TOLD MINIMAL BLOCKAGE-DONE AT [facility]  . Brain surgery 2006    TEMPORAL LOBECTOMY FOR RIGHT TEMPORAL ENCEPHALOCELE - PER OFFICE NOTES [facility]    OT Assessment/Plan/Recommendation OT Assessment Clinical Impression Statement: Pt doing very well POD2 RTKR. Pt has all necessary DME from previous knee surgery and will have prn A at home from husband. No f/u OT needed. OT Recommendation/Assessment: Patient does not need any further OT services OT Recommendation Follow Up Recommendations: No OT follow up Equipment Recommended: None recommended by OT OT  Goals    OT Evaluation Precautions/Restrictions  Precautions Precautions: Knee Required Braces or Orthoses: Knee Immobilizer - Right Knee Immobilizer - Right: Discontinue once straight leg raise with < 10 degree lag Restrictions Weight Bearing Restrictions: Yes Other Position/Activity Restrictions: WBAT Prior Functioning Home Living Lives With: Spouse Available Help at Discharge: Available 24 hours/day;Family Type of Home: House Home Access: Stairs to enter Entrance Stairs-Rails: Right;Left;Can reach both Home Layout: One level Bathroom Shower/Tub: Health visitor: Standard Home Adaptive Equipment: Bedside commode/3-in-1;Shower chair with back Prior Function Level of Independence: Independent Driving: No Vocation: Retired  ADL ADL Grooming: Performed;Wash/dry face;Set up Where Assessed - Grooming: Sitting, bed;Unsupported Upper Body Bathing: Simulated;Set up Where Assessed - Upper Body Bathing: Sitting, bed;Unsupported Lower Body Bathing: Simulated;Minimal assistance Where Assessed - Lower Body Bathing: Sit to stand from bed Upper Body Dressing: Simulated;Set up Where Assessed - Upper Body Dressing: Sitting, bed;Unsupported Lower Body Dressing: Simulated;Minimal assistance Where Assessed - Lower Body Dressing: Sit to stand from bed Toilet Transfer: Performed;Minimal assistance Toilet Transfer Details (indicate cue type and reason): Occasional VCs for safe manipulation of RW in the bathroom Toilet Transfer Method: Ambulating Toilet Transfer Equipment: Raised toilet seat with arms (or 3-in-1 over toilet) Toileting - Clothing Manipulation: Performed;Minimal assistance Where Assessed - Toileting Clothing Manipulation: Sit to stand from 3-in-1 or toilet Toileting - Hygiene: Performed;Supervision/safety Where Assessed - Toileting Hygiene: Sit to  stand from 3-in-1 or toilet Tub/Shower Transfer: Not assessed Tub/Shower Transfer Method: Not assessed Equipment  Used: Rolling walker (3:1) Ambulation Related to ADLs: Pt ambulated to bathroom with minguard A. Significant improvement with mobility from yesterday. Vision/Perception    Cognition Cognition Arousal/Alertness: Awake/alert Overall Cognitive Status: Appears within functional limits for tasks assessed Orientation Level: Oriented X4 Sensation/Coordination   Extremity Assessment RUE Assessment RUE Assessment: Within Functional Limits LUE Assessment LUE Assessment: Within Functional Limits Mobility  Bed Mobility Bed Mobility: Yes Supine to Sit: 4: Min assist;HOB elevated (Comment degrees);With rails Supine to Sit Details (indicate cue type and reason): Assist for R LE off of bed with max cuing for hand placement on bed to better simulate home environment.   Transfers Sit to Stand: 4: Min assist;From elevated surface;From bed;From chair/3-in-1;With upper extremity assist Sit to Stand Details (indicate cue type and reason): Occasional VCs for hand placement and LE position. Stand to Sit: 4: Min assist;With upper extremity assist;To chair/3-in-1;With armrests Stand to Sit Details: Occasional VCs for hand placement and LE position Exercises   End of Session OT - End of Session Equipment Utilized During Treatment: Gait belt Activity Tolerance: Patient tolerated treatment well Patient left: in chair;with call bell in reach;with family/visitor present General Behavior During Session: [facility] for tasks performed Cognition: [facility] for tasks performed   Mellody Masri A, OTR/L [phone] 02/18/2012, 9:50 AM

## 2012-02-18 NOTE — Progress Notes (Signed)
Physical Therapy Treatment Patient Details Name: Monique Hudson MRN: [id number] DOB: [birth year] Today's Date: 02/18/2012  PT Assessment/Plan  PT - Assessment/Plan Comments on Treatment Session: Pt tolerated exercises well with some increase in pain with short arc quads.  Pt refused second ambulation due to fatigue.   PT Plan: Discharge plan remains appropriate PT Frequency: 7X/week Recommendations for Other Services: OT consult Follow Up Recommendations: Home health PT Equipment Recommended: None recommended by OT PT Goals  Acute Rehab PT Goals PT Goal Formulation: With patient/family Time For Goal Achievement: 7 days Pt will Perform Home Exercise Program: with supervision, verbal cues required/provided PT Goal: Perform Home Exercise Program - Progress: Progressing toward goal  PT Treatment Precautions/Restrictions  Precautions Precautions: Knee Required Braces or Orthoses: Knee Immobilizer - Right Knee Immobilizer - Right: Discontinue once straight leg raise with < 10 degree lag Restrictions Weight Bearing Restrictions: Yes Other Position/Activity Restrictions: WBAT Mobility (including Balance) Bed Mobility Bed Mobility: No Transfers Transfers: No Ambulation/Gait Ambulation/Gait: No    Exercise  Total Joint Exercises Ankle Circles/Pumps: AROM;Both;Supine;20 reps Quad Sets: AROM;Right;10 reps;Supine Short Arc Quad: AAROM;Right;5 reps;Other (comment);Supine (Only did 5 reps due to increased pain) Heel Slides: AAROM;Right;10 reps;Supine Hip ABduction/ADduction: AAROM;Right;10 reps;Supine Straight Leg Raises: AAROM;Right;10 reps;Supine End of Session PT - End of Session Activity Tolerance: Patient limited by fatigue Patient left: in bed;with call bell in reach;with family/visitor present General Behavior During Session: [facility] for tasks performed Cognition: [facility] for tasks performed  Page, Meribeth Mattes 02/18/2012, 2:04 PM

## 2012-02-18 NOTE — Progress Notes (Signed)
Physical Therapy Treatment Patient Details Name: BELMA DYCHES MRN: [id number] DOB: [birth year] Today's Date: 02/18/2012  PT Assessment/Plan  PT - Assessment/Plan Comments on Treatment Session: Pt doing much better today with ambulation and states that pain is better controlled today.   PT Plan: Discharge plan remains appropriate PT Frequency: 7X/week Recommendations for Other Services: OT consult Follow Up Recommendations: Home health PT Equipment Recommended: None recommended by PT PT Goals  Acute Rehab PT Goals PT Goal Formulation: With patient/family Time For Goal Achievement: 7 days Pt will go Supine/Side to Sit: with supervision;with HOB 0 degrees PT Goal: Supine/Side to Sit - Progress: Progressing toward goal Pt will go Sit to Stand: with supervision PT Goal: Sit to Stand - Progress: Progressing toward goal Pt will go Stand to Sit: with supervision PT Goal: Stand to Sit - Progress: Progressing toward goal Pt will Ambulate: with supervision;with rolling walker;51 - 150 feet PT Goal: Ambulate - Progress: Updated due to goal met  PT Treatment Precautions/Restrictions  Precautions Precautions: Knee Required Braces or Orthoses: Knee Immobilizer - Right Knee Immobilizer - Right: Discontinue once straight leg raise with < 10 degree lag Restrictions Weight Bearing Restrictions: Yes Other Position/Activity Restrictions: WBAT Mobility (including Balance) Bed Mobility Bed Mobility: Yes Supine to Sit: 4: Min assist;HOB elevated (Comment degrees);With rails Supine to Sit Details (indicate cue type and reason): Assist for R LE off of bed with max cuing for hand placement on bed to better simulate home environment.   Transfers Transfers: Yes Sit to Stand: 4: Min assist;From elevated surface;From bed;From chair/3-in-1;With upper extremity assist Sit to Stand Details (indicate cue type and reason): Occasional VCs for hand placement and LE position. Stand to Sit: 4: Min assist;With  upper extremity assist;To chair/3-in-1;With armrests Stand to Sit Details: Occasional VCs for hand placement and LE position Ambulation/Gait Ambulation/Gait: Yes Ambulation/Gait Assistance: 1: +2 Total assist;Patient percentage (comment) Ambulation/Gait Assistance Details (indicate cue type and reason): Pt assist 85%.  +2 for safety and line management/chair follow.  Cues for sequencing/technique and upright posture.  Ambulation Distance (Feet): 180 Feet (then another 15' ) Assistive device: Rolling walker Gait Pattern: Step-to pattern;Decreased stride length;Trunk flexed Gait velocity: decreased    Exercise    End of Session PT - End of Session Equipment Utilized During Treatment: Right knee immobilizer;Gait belt Activity Tolerance: Patient tolerated treatment well Patient left: in chair;with call bell in reach;with family/visitor present Nurse Communication: Mobility status for transfers General Behavior During Session: [facility] for tasks performed Cognition: [facility] for tasks performed  Page, Meribeth Mattes 02/18/2012, 9:50 AM

## 2012-02-18 NOTE — Progress Notes (Signed)
Subjective: Procedure(s) (LRB): TOTAL KNEE ARTHROPLASTY (Right) 2 Days Post-Op  Patient reports pain as 4 on 0-10 scale.  Positive void Negative bowel movement Positive flatus Negative chest pain or shortness of breath  Objective: Vital signs in last 24 hours: Temp:  [98.5 F (36.9 C)-99 F (37.2 C)] 98.7 F (37.1 C) (04/13 0626) Pulse Rate:  [70-81] 81  (04/13 0626) Resp:  [14-18] 16  (04/13 0626) BP: (148-158)/(67-76) 157/76 mmHg (04/13 0626) SpO2:  [80 %-98 %] 92 % (04/13 0626)  Intake/Output from previous day: 04/12 0701 - 04/13 0700 In: 3031 [P.O.:820; I.V.:2211] Out: 2175 [Urine:2175]   Basename 02/18/12 0510 02/17/12 0357  WBC 8.9 7.9  RBC 3.65* 3.79*  HCT 32.5* 34.5*  PLT 164 167    Basename 02/17/12 0357  NA 130*  K 5.0  CL 94*  CO2 28  BUN 17  CREATININE 0.81  GLUCOSE 131*  CALCIUM 8.4   No results found for this basename: LABPT:2,INR:2 in the last 72 hours  ABD soft Neurovascular intact Sensation intact distally Intact pulses distally Dorsiflexion/Plantar flexion intact Incision: dressing C/D/I Compartment soft Dressing removed- incision looks good  Assessment/Plan: Patient stable  Redress knee Continue mobilization with physical therapy Continue care - plan is to dc to home on Monday   Demecia Northway J 02/18/2012, 9:15 AM

## 2012-02-19 LAB — CBC
HCT: 33.8 % — ABNORMAL LOW (ref 36.0–46.0)
MCHC: 33.4 g/dL (ref 30.0–36.0)
MCV: 88.9 fL (ref 78.0–100.0)
Platelets: 189 10*3/uL (ref 150–400)
RDW: 14.1 % (ref 11.5–15.5)
WBC: 9.1 10*3/uL (ref 4.0–10.5)

## 2012-02-19 MED ORDER — METHOCARBAMOL 500 MG PO TABS: 500.0000 mg | ORAL_TABLET | Freq: Four times a day (QID) | ORAL | Status: AC | PRN

## 2012-02-19 MED ORDER — HYDROCODONE-ACETAMINOPHEN 7.5-325 MG PO TABS: 1.0000 | ORAL_TABLET | ORAL | Status: AC | PRN

## 2012-02-19 MED ORDER — DSS 100 MG PO CAPS: 100.0000 mg | ORAL_CAPSULE | Freq: Two times a day (BID) | ORAL | Status: AC

## 2012-02-19 MED ORDER — RIVAROXABAN 10 MG PO TABS: 10.0000 mg | ORAL_TABLET | Freq: Every day | ORAL | Status: DC

## 2012-02-19 MED ORDER — POLYETHYLENE GLYCOL 3350 17 G PO PACK: 17.0000 g | PACK | Freq: Every day | ORAL | Status: AC | PRN

## 2012-02-19 NOTE — Progress Notes (Signed)
Physical Therapy Treatment Patient Details Name: Monique Hudson MRN: [id number] DOB: [birth year] Today's Date: 02/19/2012  PT Assessment/Plan  PT - Assessment/Plan Comments on Treatment Session: Pt doing very well with ambulation/exercises and performed stair negotiation with little assist/cues.  Daughter present for education.  Pt to D/C today.  PT Plan: Discharge plan remains appropriate PT Frequency: 7X/week Follow Up Recommendations: Home health PT Equipment Recommended: None recommended by OT PT Goals  Acute Rehab PT Goals PT Goal Formulation: With patient/family Time For Goal Achievement: 7 days Pt will go Sit to Supine/Side: with supervision;with HOB 0 degrees PT Goal: Sit to Supine/Side - Progress: Progressing toward goal Pt will go Sit to Stand: with supervision PT Goal: Sit to Stand - Progress: Met Pt will go Stand to Sit: with supervision PT Goal: Stand to Sit - Progress: Met Pt will Ambulate: with supervision;with rolling walker;51 - 150 feet PT Goal: Ambulate - Progress: Met Pt will Go Up / Down Stairs: 1-2 stairs;with min assist;with least restrictive assistive device PT Goal: Up/Down Stairs - Progress: Met Pt will Perform Home Exercise Program: with supervision, verbal cues required/provided PT Goal: Perform Home Exercise Program - Progress: Met  PT Treatment Precautions/Restrictions  Precautions Precautions: Knee Required Braces or Orthoses: Knee Immobilizer - Right Knee Immobilizer - Right: Discontinue once straight leg raise with < 10 degree lag Restrictions Weight Bearing Restrictions: No Other Position/Activity Restrictions: WBAT Mobility (including Balance) Bed Mobility Bed Mobility: Yes Sit to Supine: 4: Min assist;HOB flat Sit to Supine - Details (indicate cue type and reason): Min assist for RLE into bed with cues for UE placement instead of using trapeze for assist.   Transfers Transfers: Yes Sit to Stand: 5: Supervision;With upper extremity  assist;With armrests;From chair/3-in-1 Sit to Stand Details (indicate cue type and reason): Max cuing for hand placement on arm rests.   Stand to Sit: 5: Supervision;With upper extremity assist;To elevated surface;To bed Stand to Sit Details: Cues to keep RW with her until seated for safety with cues for reaching for bed.   Ambulation/Gait Ambulation/Gait: Yes Ambulation/Gait Assistance: 5: Supervision Ambulation/Gait Assistance Details (indicate cue type and reason): Supervision for safety with cues for upright posture.  Ambulation Distance (Feet): 200 Feet Assistive device: Rolling walker Gait Pattern: Step-to pattern;Decreased stride length;Trunk flexed Gait velocity: decreased Stairs: Yes Stairs Assistance: 4: Min assist Stairs Assistance Details (indicate cue type and reason): Cues for sequencing/technique for going down stairs, pt remembered sequencing for going up stairs.   Stair Management Technique: Two rails;Step to pattern;Forwards Number of Stairs: 2  Height of Stairs: 6     Exercise  Total Joint Exercises Ankle Circles/Pumps: AROM;Both;Supine;20 reps Quad Sets: AROM;Right;Supine;5 reps Short Arc QuadBarbaraann Boys;Right;5 reps;Other (comment);Supine Heel Slides: AAROM;Right;Supine;5 reps Hip ABduction/ADduction: AAROM;Right;Supine;5 reps Straight Leg Raises: AAROM;Right;Supine;5 reps End of Session PT - End of Session Equipment Utilized During Treatment: Right knee immobilizer;Gait belt Activity Tolerance: Patient tolerated treatment well Patient left: in bed;with call bell in reach;with family/visitor present General Behavior During Session: [facility] for tasks performed Cognition: [facility] for tasks performed  Page, Meribeth Mattes 02/19/2012, 10:28 AM

## 2012-02-19 NOTE — Progress Notes (Signed)
Subjective: 3 Days Post-Op Procedure(s) (LRB): TOTAL KNEE ARTHROPLASTY (Right)   Patient reports pain as mild. Pain well controlled. No events throughout the night  Objective:   VITALS:   Filed Vitals:   02/19/12 0440  BP: 145/66  Pulse: 76  Temp: 98.1 F (36.7 C)  Resp: 20    Neurovascular intact Dorsiflexion/Plantar flexion intact Incision: dressing C/D/I No cellulitis present Compartment soft  LABS  Basename 02/19/12 0451 02/18/12 0510 02/17/12 0357  HGB 11.3* 10.7* 11.2*  HCT 33.8* 32.5* 34.5*  WBC 9.1 8.9 7.9  PLT 189 164 167     Basename 02/17/12 0357  NA 130*  K 5.0  BUN 17  CREATININE 0.81  GLUCOSE 131*     Assessment/Plan: 3 Days Post-Op Procedure(s) (LRB): TOTAL KNEE ARTHROPLASTY (Right)   Patient stable, and feels good. Up with therapy Continue care - plan is to d/c to home on Monday   Gretna Bergin S. Devory Mckinzie   PAC  02/19/2012, 8:21 AM

## 2012-02-19 NOTE — Progress Notes (Signed)
Patient received discharge instructions and verbalized understanding.  Patient discharged home, with family. No changes in assessment. [place]

## 2012-02-20 NOTE — Discharge Summary (Signed)
Patient ID: Monique Hudson MRN: [id number] DOB/AGE: Apr 04, 1935 77 y.o.  Admit date: 02/16/2012 Discharge date: 02/20/2012  Admission Diagnoses:  Osteoarthritis right knee Past Medical History  Diagnosis Date  . Asthma   . Hypertension   . Heart murmur   . Restless leg syndrome   . Hypothyroidism   . Arthritis     PAIN AND ARTHRITITS RIGHT KNEE  . Shortness of breath     WITH EXERTION  . Bronchitis     HX OF CHRONIC BRONCHITIS--NO RECENT PROBLEMS  . Sleep apnea     NOT USING CPAP - IS USING OXYGEN 2 L PER NASAL CANNULA WHEN SLEEPING  . Claustrophobia   . Complication of anesthesia     PT REPORTS PROBLEMS WITH HER OXYGEN SATURATION AFTER ONE OF HER PAST SURGERIES.  PT STATES SPINAL FOR MOST RECENT LEFT TKA ON 08/12/11 AT [facility] AND NO PROBLEMS.   Discharge Diagnoses:  Same s/p right TKA   Surgeries: Procedure(s): TOTAL KNEE ARTHROPLASTY on 02/16/2012   Consultants:  PT/OT  Discharged Condition: Improved  Hospital Course: DEMMI SINDT is an 77 y.o. female who was admitted 02/16/2012 for operative treatment of osteoarthritis right knee. Patient has severe unremitting pain that affects sleep, daily activities, and work/hobbies. After pre-op clearance the patient was taken to the operating room on 02/16/2012 and underwent  Procedure(s): TOTAL KNEE ARTHROPLASTY.    Patient was given perioperative antibiotics: Anti-infectives     Start     Dose/Rate Route Frequency Ordered Stop   02/16/12 1400   ceFAZolin (ANCEF) IVPB 2 g/50 mL premix        2 g 100 mL/hr over 30 Minutes Intravenous Every 6 hours 02/16/12 1223 02/17/12 0146   02/16/12 1230   clindamycin (CLEOCIN) IVPB 600 mg  Status:  Discontinued        600 mg 100 mL/hr over 30 Minutes Intravenous 4 times per day 02/16/12 1223 02/16/12 1227   02/16/12 0909   polymyxin B 500,000 Units, bacitracin 50,000 Units in sodium chloride irrigation 0.9 % 500 mL irrigation  Status:  Discontinued          As needed 02/16/12 0910  02/16/12 0945   02/16/12 0557   ceFAZolin (ANCEF) IVPB 2 g/50 mL premix        2 g 100 mL/hr over 30 Minutes Intravenous 60 min pre-op 02/16/12 0557 02/16/12 0740           Patient was given sequential compression devices, early ambulation, and chemoprophylaxis to prevent DVT.  Patient benefited maximally from hospital stay and there were no complications.    Recent vital signs:   Filed Vitals:    02/19/12 0440   BP:  145/66   Pulse:  76   Temp:  98.1 F (36.7 C)   Resp:  20        Recent laboratory studies:  Basename 02/19/12 0451 2012-02-22 0510  WBC 9.1 8.9  HGB 11.3* 10.7*  HCT 33.8* 32.5*  PLT 189 164  NA -- --  K -- --  CL -- --  CO2 -- --  BUN -- --  CREATININE -- --  GLUCOSE -- --  INR -- --  CALCIUM -- --     Discharge Medications:   Medication List  As of 02/20/2012 12:42 PM   STOP taking these medications         ibuprofen 200 MG tablet         TAKE these medications  albuterol 108 (90 BASE) MCG/ACT inhaler   Commonly known as: PROVENTIL HFA;VENTOLIN HFA   Inhale 2 puffs into the lungs every 6 (six) hours as needed. Wheezing        cyanocobalamin 1000 MCG/ML injection   Commonly known as: (VITAMIN B-12)   Inject 1,000 mcg into the muscle every 30 (thirty) days.      DSS 100 MG Caps   Take 100 mg by mouth 2 (two) times daily.      HYDROcodone-acetaminophen 7.5-325 MG per tablet   Commonly known as: NORCO   Take 1-2 tablets by mouth every 4 (four) hours as needed for pain.      indapamide 1.25 MG tablet   Commonly known as: LOZOL   Take 1.25 mg by mouth every morning.      levothyroxine 125 MCG tablet   Commonly known as: SYNTHROID, LEVOTHROID   Take 125 mcg by mouth daily before breakfast.      methocarbamol 500 MG tablet   Commonly known as: ROBAXIN   Take 1 tablet (500 mg total) by mouth every 6 (six) hours as needed (muscle spasms).      omega-3 acid ethyl esters 1 G capsule   Commonly known as: LOVAZA   Take 2 g  by mouth daily.      OVER THE COUNTER MEDICATION   Apply 1 application topically 3 (three) times daily as needed. Horse LINAMENT Cream - pain        polyethylene glycol packet   Commonly known as: MIRALAX / GLYCOLAX   Take 17 g by mouth daily as needed.      pramipexole 0.5 MG tablet   Commonly known as: MIRAPEX   Take 0.5 mg by mouth 3 (three) times daily.      rivaroxaban 10 MG Tabs tablet   Commonly known as: XARELTO   Take 1 tablet (10 mg total) by mouth daily with breakfast.      rosuvastatin 10 MG tablet   Commonly known as: CRESTOR   Take 10 mg by mouth at bedtime.      TWYNSTA 80-5 MG Tabs   Generic drug: Telmisartan-Amlodipine   Take 1 tablet by mouth daily before breakfast.      VITAMIN B-6 PO   Take 1 tablet by mouth daily.      vitamin C 1000 MG tablet   Take 1,000 mg by mouth daily.      Vitamin D-3 5000 UNITS Tabs   Take 5,000 Units by mouth daily.            Diagnostic Studies: Dg Knee 1-2 Views Right  02/08/2012  *RADIOLOGY REPORT*  Clinical Data: Preop for right total knee replacement the  RIGHT KNEE - 1-2 VIEW  Comparison: None.  Findings: Moderate medial and patellofemoral degenerative changes are needed present.  Large patellar osteophytes are evident.  The knee is located.  No acute bone or soft tissue abnormality is present.  IMPRESSION: Moderate degenerative changes are particularly evident in the medial and patellofemoral compartments of the knees.  Original Report Authenticated By: Jamesetta Orleans. MATTERN, M.D.   X-ray Knee Right Port  02/16/2012  *RADIOLOGY REPORT*  Clinical Data: Right knee replacement.  PORTABLE RIGHT KNEE - 1-2 VIEW  Comparison: 02/08/2012  Findings: Changes of right knee replacement.  Soft tissue drain in place.  Normal alignment.  No hardware or bony complicating feature.  IMPRESSION: Right knee replacement.  No complicating feature.  Original Report Authenticated By: Cyndie Chime, M.D.  Disposition: 06-Home-Health Care  Svc  Discharge Orders    Future Orders Please Complete By Expires   Diet - low sodium heart healthy      Call MD / Call 911      Comments:   If you experience chest pain or shortness of breath, CALL 911 and be transported to the hospital emergency room.  If you develope a fever above 101 F, pus (white drainage) or increased drainage or redness at the wound, or calf pain, call your surgeon's office.   Discharge instructions      Comments:   Daily dressing changes with gauze and tape. Keep the area dry and clean until follow up. Follow up in 2 weeks at [facility]. Call with any questions or concerns.     Constipation Prevention      Comments:   Drink plenty of fluids.  Prune juice may be helpful.  You may use a stool softener, such as Colace (over the counter) 100 mg twice a day.  Use MiraLax (over the counter) for constipation as needed.   Increase activity slowly as tolerated      Weight Bearing as taught in Physical Therapy      Comments:   Use a walker or crutches as instructed.   Driving restrictions      Comments:   No driving for 4 weeks   TED hose      Comments:   Use stockings (TED hose) for 2 weeks on both leg(s).  You may remove them at night for sleeping.   Change dressing      Comments:   Daily dressing changes with sterile 4 x 4 inch gauze dressing and tape. Keep the area dry and clean.       Patient had positive urine culture so will treat with Cipro outpatient  Signed: Jadiel Schmieder R. 02/20/2012, 12:42 PM

## 2012-02-29 ENCOUNTER — Encounter (HOSPITAL_COMMUNITY): Payer: Self-pay | Admitting: Specialist

## 2012-03-11 ENCOUNTER — Emergency Department (HOSPITAL_BASED_OUTPATIENT_CLINIC_OR_DEPARTMENT_OTHER)
Admission: EM | Admit: 2012-03-11 | Discharge: 2012-03-11 | Disposition: A | Discharge status: Home / Self Care | Payer: Medicare Other | Attending: Emergency Medicine | Admitting: Emergency Medicine

## 2012-03-11 ENCOUNTER — Emergency Department
Admission: EM | Admit: 2012-03-11 | Discharge: 2012-03-11 | Disposition: A | Discharge status: Hospice | Payer: Medicare Other | Source: Home / Self Care

## 2012-03-11 ENCOUNTER — Emergency Department (INDEPENDENT_AMBULATORY_CARE_PROVIDER_SITE_OTHER): Payer: Medicare Other

## 2012-03-11 ENCOUNTER — Encounter: Payer: Self-pay | Admitting: *Deleted

## 2012-03-11 ENCOUNTER — Encounter (HOSPITAL_BASED_OUTPATIENT_CLINIC_OR_DEPARTMENT_OTHER): Payer: Self-pay | Admitting: *Deleted

## 2012-03-11 DIAGNOSIS — E039 Hypothyroidism, unspecified: Secondary | ICD-10-CM | POA: Insufficient documentation

## 2012-03-11 DIAGNOSIS — R059 Cough, unspecified: Secondary | ICD-10-CM

## 2012-03-11 DIAGNOSIS — J45909 Unspecified asthma, uncomplicated: Secondary | ICD-10-CM | POA: Insufficient documentation

## 2012-03-11 DIAGNOSIS — J069 Acute upper respiratory infection, unspecified: Secondary | ICD-10-CM

## 2012-03-11 DIAGNOSIS — R05 Cough: Secondary | ICD-10-CM

## 2012-03-11 DIAGNOSIS — I10 Essential (primary) hypertension: Secondary | ICD-10-CM | POA: Insufficient documentation

## 2012-03-11 MED ORDER — IPRATROPIUM BROMIDE 0.02 % IN SOLN
0.5000 mg | Freq: Once | RESPIRATORY_TRACT | Status: AC
  Administered 2012-03-11: 0.5 mg via RESPIRATORY_TRACT
  Filled 2012-03-11: qty 2.5

## 2012-03-11 MED ORDER — ALBUTEROL SULFATE (5 MG/ML) 0.5% IN NEBU
5.0000 mg | INHALATION_SOLUTION | Freq: Once | RESPIRATORY_TRACT | Status: AC
  Administered 2012-03-11: 5 mg via RESPIRATORY_TRACT
  Filled 2012-03-11: qty 2.5

## 2012-03-11 MED ORDER — GI COCKTAIL ~~LOC~~: 30.0000 mL | Freq: Once | ORAL | Status: DC

## 2012-03-11 NOTE — ED Notes (Signed)
Patient was sent from UC to be evaluated for a possible PE. Patient has had a cough for appx a week, with some sinus pressure and drainage. UC dr concerned because pt has had recent surgery.

## 2012-03-11 NOTE — ED Notes (Signed)
Pt developed a fever today and has had sinus pressure and yellow nasal discharge.  Pt had knee surgery 3-4 weeks ago.  Pt son had a pos flu B test this wk.

## 2012-03-11 NOTE — Discharge Instructions (Signed)
Use albuterol inhaler, 2 puffs every 4 hours, as needed for cough and shortness of breath.  Try over the counter coricidin-D for nasal congestion and sinus pressure.  If this medication does not give you relief, you can take one dose of sudafed a day, but monitor your blood pressure and do not take for more than 3-4 days.  Take low dose ibuprofen or tylenol for fever.  Follow up with your primary care doctor if your symptoms have not started to improve by Friday.  You should return to the ER if you develop chest pain or difficulty breathing.

## 2012-03-11 NOTE — ED Provider Notes (Signed)
History     CSN: [id number]  Arrival date & time 03/11/12  1616   First MD Initiated Contact with Patient 03/11/12 1640      Chief Complaint  Patient presents with  . Cough    (Consider location/radiation/quality/duration/timing/severity/associated sxs/prior treatment) HPI History provided by pt.  Pt transferred from [facility] for further evaluation of productive cough and chest congestion x 6 days.  Associated w/ sinus pressure, nasal congestion and green drainage from nose.  No known fever and denies CP, SOB, hemoptysis, peripheral edema and calf pain.  [facility], diagnosed w/ allergic rhinitis and started on claritin.  Pt reports no relief with this medication.  She has no prior h/o seasonal allergies.  Urgent care physician concerned patient could have a PE.  Pt had a total right knee replacement 3 weeks ago.  Per patient, she was hospitalized three days and was ambulated w/ PT w/in 24hrs of surgery.  Xarelto d/c'd this week.    Past Medical History  Diagnosis Date  . Asthma   . Hypertension   . Heart murmur   . Restless leg syndrome   . Hypothyroidism   . Arthritis     PAIN AND ARTHRITITS RIGHT KNEE  . Shortness of breath     WITH EXERTION  . Bronchitis     HX OF CHRONIC BRONCHITIS--NO RECENT PROBLEMS  . Sleep apnea     NOT USING CPAP - IS USING OXYGEN 2 L PER NASAL CANNULA WHEN SLEEPING  . Claustrophobia   . Complication of anesthesia     PT REPORTS PROBLEMS WITH HER OXYGEN SATURATION AFTER ONE OF HER PAST SURGERIES.  PT STATES SPINAL FOR MOST RECENT LEFT TKA ON 08/12/11 AT [facility] AND NO PROBLEMS.    Past Surgical History  Procedure Date  . Joint replacement 08/12/2011    LEFT TOTAL KNEE ARTHROPLASTY  . Eye surgery 2011    BILATERAL CATARACT EXTRACTION  . Abdominal hysterectomy 2000  . Lipomas removed from arms and abdomen   . Breast surgery 1982     CYST REMOVED LEFT BREAST  . Resection of ovaries 1964    FOR  OVULATION PURPOSE  . Appendectomy   . Cardiac catheterization     APPROXIMATELY 6 YRS AGO--TOLD MINIMAL BLOCKAGE-DONE AT [facility]  . Brain surgery 2006    TEMPORAL LOBECTOMY FOR RIGHT TEMPORAL ENCEPHALOCELE - PER OFFICE NOTES [facility]  . Total knee arthroplasty 02/16/2012    Procedure: TOTAL KNEE ARTHROPLASTY;  Surgeon: Javier Docker, MD;  Location: [facility];  Service: Orthopedics;  Laterality: Right;    No family history on file.  History  Substance Use Topics  . Smoking status: Former Games developer  . Smokeless tobacco: Not on file   Comment: 1996  . Alcohol Use: Yes     OCCAS    OB History    Grav Para Term Preterm Abortions TAB SAB Ect Mult Living                  Review of Systems  All other systems reviewed and are negative.    Allergies  Review of patient's allergies indicates no known allergies.  Home Medications   Current Outpatient Rx  Name Route Sig Dispense Refill  . ALBUTEROL SULFATE HFA 108 (90 BASE) MCG/ACT IN AERS Inhalation Inhale 2 puffs into the lungs every 6 (six) hours as needed. Wheezing     . VITAMIN C 1000 MG PO TABS Oral Take  1,000 mg by mouth daily.    Marland Kitchen VITAMIN D-3 5000 UNITS PO TABS Oral Take 5,000 Units by mouth daily.    . CYANOCOBALAMIN 1000 MCG/ML IJ SOLN Intramuscular Inject 1,000 mcg into the muscle every 30 (thirty) days.    . INDAPAMIDE 1.25 MG PO TABS Oral Take 1.25 mg by mouth every morning.    Marland Kitchen LEVOTHYROXINE SODIUM 125 MCG PO TABS Oral Take 125 mcg by mouth daily before breakfast.    . OMEGA-3-ACID ETHYL ESTERS 1 G PO CAPS Oral Take 2 g by mouth daily.    Marland Kitchen OVER THE COUNTER MEDICATION Topical Apply 1 application topically 3 (three) times daily as needed. Horse LINAMENT Cream - pain     . PRAMIPEXOLE DIHYDROCHLORIDE 0.5 MG PO TABS Oral Take 0.5 mg by mouth 3 (three) times daily.    Marland Kitchen VITAMIN B-6 PO Oral Take 1 tablet by mouth daily.    Marland Kitchen RIVAROXABAN 10 MG PO TABS Oral Take 1 tablet (10 mg total) by mouth  daily with breakfast. 14 tablet 0  . ROSUVASTATIN CALCIUM 10 MG PO TABS Oral Take 10 mg by mouth at bedtime.    . TELMISARTAN-AMLODIPINE 80-5 MG PO TABS Oral Take 1 tablet by mouth daily before breakfast.      There were no vitals taken for this visit.  Physical Exam  Nursing note and vitals reviewed. Constitutional: She is oriented to person, place, and time. She appears well-developed and well-nourished. No distress.  HENT:  Head: Normocephalic and atraumatic. No trismus in the jaw.  Mouth/Throat: Uvula is midline and mucous membranes are normal. No posterior oropharyngeal edema or posterior oropharyngeal erythema.       No sinus tenderness. Oropharynx clear.  Uvula mid-line.  No trismus.  No nasal discharge.   Eyes:       Normal appearance  Neck: Normal range of motion. Neck supple.  Cardiovascular: Normal rate and regular rhythm.   Pulmonary/Chest: Effort normal and breath sounds normal. She exhibits no tenderness.       Reports no pleuritic pain.  Dry cough  Musculoskeletal: Normal range of motion.       No peripheral edema or calf tenderness.  Right knee w/ surgical scar.  Mild anterior edema.   Lymphadenopathy:    She has no cervical adenopathy.  Neurological: She is alert and oriented to person, place, and time.  Skin: Skin is warm and dry. No rash noted.  Psychiatric: She has a normal mood and affect. Her behavior is normal.    ED Course  Procedures (including critical care time)  Labs Reviewed - No data to display Dg Chest 2 View  03/11/2012  *RADIOLOGY REPORT*  Clinical Data: Cough.  CHEST - 2 VIEW  Comparison: 08/05/2011.  Findings: The cardiac silhouette, mediastinal and hilar contours are stable.  There is mild peribronchial thickening which may suggest bronchitis.  No infiltrates or effusions.  Minimal streaky basilar atelectasis.  The bony thorax is intact.  IMPRESSION: Bronchitic changes but no focal infiltrates.  Original Report Authenticated By: P. Loralie Champagne,  M.D.     1. Viral URI with cough       MDM  Pt transferred from [facility] to r/o PE because of recent total knee arthroplasty.  Pt c/o cough and chest congestion + other URI sx x 6 days w/ known sick contact.  Doubt PE.  Was ambulating w/in 24hrs of surgery and has no other RF for blood clot.  She denies CP, SOB, hemoptysis, peripheral edema/calf  pain.  No tachycardia or tachypnea or hypoxia on exam.  S/sx much more consistent w/ viral illness.  CXR ordered to r/o pneumonia d/t duration of cough and recent surgical history and shows bronchitic changes.  Pt is comfortable w/ not being evaluated further for PE.  She has an albuterol inhaler at home that she will use for cough and I recommended tylenol for fever and sudafed for sinus/nasal congestion.  Strict return precautions discussed.        Otilio Miu, PA 03/12/12 1400

## 2012-03-12 NOTE — ED Provider Notes (Signed)
Medical screening examination/treatment/procedure(s) were performed by non-physician practitioner and as supervising physician I was immediately available for consultation/collaboration.   Gavin Pound. Siddhi Dornbush, MD 03/12/12 1523

## 2012-04-25 ENCOUNTER — Ambulatory Visit (HOSPITAL_COMMUNITY)
Admission: RE | Admit: 2012-04-25 | Discharge: 2012-04-25 | Disposition: A | Discharge status: Home / Self Care | Payer: Medicare Other | Source: Ambulatory Visit | Attending: Specialist | Admitting: Specialist

## 2012-04-25 DIAGNOSIS — M79606 Pain in leg, unspecified: Secondary | ICD-10-CM

## 2012-04-25 DIAGNOSIS — Z96659 Presence of unspecified artificial knee joint: Secondary | ICD-10-CM | POA: Insufficient documentation

## 2012-04-25 DIAGNOSIS — M7989 Other specified soft tissue disorders: Secondary | ICD-10-CM

## 2012-04-25 DIAGNOSIS — M79609 Pain in unspecified limb: Secondary | ICD-10-CM | POA: Insufficient documentation

## 2012-04-25 NOTE — Progress Notes (Signed)
VASCULAR LAB PRELIMINARY  PRELIMINARY  PRELIMINARY  PRELIMINARY  Right lower extremity venous duplex completed.    Preliminary report:  Right:  No evidence of DVT, superficial thrombosis, or Baker's cyst.  Khup Sapia, RVS 04/25/2012, 4:04 PM

## 2012-12-28 ENCOUNTER — Encounter (HOSPITAL_COMMUNITY): Payer: Self-pay | Admitting: Emergency Medicine

## 2012-12-28 ENCOUNTER — Emergency Department (HOSPITAL_COMMUNITY): Payer: Medicare Other

## 2012-12-28 ENCOUNTER — Emergency Department (HOSPITAL_COMMUNITY)
Admission: EM | Admit: 2012-12-28 | Discharge: 2012-12-28 | Disposition: A | Discharge status: Home / Self Care | Payer: Medicare Other | Attending: Emergency Medicine | Admitting: Emergency Medicine

## 2012-12-28 DIAGNOSIS — M1710 Unilateral primary osteoarthritis, unspecified knee: Secondary | ICD-10-CM | POA: Insufficient documentation

## 2012-12-28 DIAGNOSIS — R011 Cardiac murmur, unspecified: Secondary | ICD-10-CM | POA: Insufficient documentation

## 2012-12-28 DIAGNOSIS — S8990XA Unspecified injury of unspecified lower leg, initial encounter: Secondary | ICD-10-CM | POA: Insufficient documentation

## 2012-12-28 DIAGNOSIS — J45909 Unspecified asthma, uncomplicated: Secondary | ICD-10-CM | POA: Insufficient documentation

## 2012-12-28 DIAGNOSIS — S01501A Unspecified open wound of lip, initial encounter: Secondary | ICD-10-CM | POA: Insufficient documentation

## 2012-12-28 DIAGNOSIS — G2581 Restless legs syndrome: Secondary | ICD-10-CM | POA: Insufficient documentation

## 2012-12-28 DIAGNOSIS — I10 Essential (primary) hypertension: Secondary | ICD-10-CM | POA: Insufficient documentation

## 2012-12-28 DIAGNOSIS — Y9301 Activity, walking, marching and hiking: Secondary | ICD-10-CM | POA: Insufficient documentation

## 2012-12-28 DIAGNOSIS — Z87891 Personal history of nicotine dependence: Secondary | ICD-10-CM | POA: Insufficient documentation

## 2012-12-28 DIAGNOSIS — Y929 Unspecified place or not applicable: Secondary | ICD-10-CM | POA: Insufficient documentation

## 2012-12-28 DIAGNOSIS — Z7982 Long term (current) use of aspirin: Secondary | ICD-10-CM | POA: Insufficient documentation

## 2012-12-28 DIAGNOSIS — Z8709 Personal history of other diseases of the respiratory system: Secondary | ICD-10-CM | POA: Insufficient documentation

## 2012-12-28 DIAGNOSIS — Z79899 Other long term (current) drug therapy: Secondary | ICD-10-CM | POA: Insufficient documentation

## 2012-12-28 DIAGNOSIS — S42209A Unspecified fracture of upper end of unspecified humerus, initial encounter for closed fracture: Secondary | ICD-10-CM

## 2012-12-28 DIAGNOSIS — E039 Hypothyroidism, unspecified: Secondary | ICD-10-CM | POA: Insufficient documentation

## 2012-12-28 DIAGNOSIS — G4730 Sleep apnea, unspecified: Secondary | ICD-10-CM | POA: Insufficient documentation

## 2012-12-28 DIAGNOSIS — W010XXA Fall on same level from slipping, tripping and stumbling without subsequent striking against object, initial encounter: Secondary | ICD-10-CM | POA: Insufficient documentation

## 2012-12-28 LAB — URINALYSIS, ROUTINE W REFLEX MICROSCOPIC
Bilirubin Urine: NEGATIVE
Hgb urine dipstick: NEGATIVE
Specific Gravity, Urine: 1.021 (ref 1.005–1.030)
Urobilinogen, UA: 0.2 mg/dL (ref 0.0–1.0)

## 2012-12-28 LAB — CBC WITH DIFFERENTIAL/PLATELET
Basophils Relative: 0 % (ref 0–1)
Eosinophils Absolute: 0.2 10*3/uL (ref 0.0–0.7)
Eosinophils Relative: 2 % (ref 0–5)
HCT: 39.9 % (ref 36.0–46.0)
Hemoglobin: 12.9 g/dL (ref 12.0–15.0)
MCH: 29.5 pg (ref 26.0–34.0)
MCHC: 32.3 g/dL (ref 30.0–36.0)
MCV: 91.1 fL (ref 78.0–100.0)
Monocytes Absolute: 0.6 10*3/uL (ref 0.1–1.0)
Monocytes Relative: 7 % (ref 3–12)

## 2012-12-28 LAB — BASIC METABOLIC PANEL
BUN: 24 mg/dL — ABNORMAL HIGH (ref 6–23)
Chloride: 101 mEq/L (ref 96–112)
Creatinine, Ser: 1.18 mg/dL — ABNORMAL HIGH (ref 0.50–1.10)
GFR calc Af Amer: 51 mL/min — ABNORMAL LOW (ref >90)
GFR calc non Af Amer: 44 mL/min — ABNORMAL LOW (ref >90)

## 2012-12-28 MED ORDER — HYDROMORPHONE HCL PF 1 MG/ML IJ SOLN
1.0000 mg | Freq: Once | INTRAMUSCULAR | Status: AC
  Administered 2012-12-28: 1 mg via INTRAVENOUS
  Filled 2012-12-28: qty 1

## 2012-12-28 MED ORDER — OXYCODONE-ACETAMINOPHEN 5-325 MG PO TABS: 1.0000 | ORAL_TABLET | ORAL | Status: DC | PRN

## 2012-12-28 MED ORDER — MORPHINE SULFATE 4 MG/ML IJ SOLN
4.0000 mg | Freq: Once | INTRAMUSCULAR | Status: AC
  Administered 2012-12-28: 4 mg via INTRAVENOUS
  Filled 2012-12-28: qty 1

## 2012-12-28 MED ORDER — SODIUM CHLORIDE 0.9 % IV SOLN
Freq: Once | INTRAVENOUS | Status: AC
Start: 2012-12-28 — End: 2012-12-28
  Administered 2012-12-28: 21:00:00 via INTRAVENOUS

## 2012-12-28 MED ORDER — SODIUM CHLORIDE 0.9 % IV BOLUS (SEPSIS)
500.0000 mL | Freq: Once | INTRAVENOUS | Status: AC
  Administered 2012-12-28: 500 mL via INTRAVENOUS

## 2012-12-28 NOTE — ED Notes (Signed)
Patient transported to CT 

## 2012-12-28 NOTE — ED Provider Notes (Signed)
History     CSN: [id number]  Arrival date & time 12/28/12  1842   First MD Initiated Contact with Patient 12/28/12 2000      Chief Complaint  Patient presents with  . Fall  . Shoulder Pain  . Knee Pain    (Consider location/radiation/quality/duration/timing/severity/associated sxs/prior treatment) HPI Comments: Pt comes in post fall. Pt was walking towards a building, didn't notice a curb, and tripped forward. She is complaining of pain to the right knee- where she had knee replacement, and also right sided shoulder pain. She also ended up cutting her lip, but has no headaches, and denies any neurologic complains. Pt is not on any anticoagulants.   Patient is a 78 y.o. female presenting with fall, shoulder pain, and knee pain. The history is provided by the patient.  Fall Pertinent negatives include no abdominal pain, no nausea, no vomiting and no hematuria.  Shoulder Pain Pertinent negatives include no chest pain, no abdominal pain and no shortness of breath.  Knee Pain Associated symptoms: no neck pain     Past Medical History  Diagnosis Date  . Asthma   . Hypertension   . Heart murmur   . Restless leg syndrome   . Hypothyroidism   . Arthritis     PAIN AND ARTHRITITS RIGHT KNEE  . Shortness of breath     WITH EXERTION  . Bronchitis     HX OF CHRONIC BRONCHITIS--NO RECENT PROBLEMS  . Sleep apnea     NOT USING CPAP - IS USING OXYGEN 2 L PER NASAL CANNULA WHEN SLEEPING  . Claustrophobia   . Complication of anesthesia     PT REPORTS PROBLEMS WITH HER OXYGEN SATURATION AFTER ONE OF HER PAST SURGERIES.  PT STATES SPINAL FOR MOST RECENT LEFT TKA ON 08/12/11 AT [facility] AND NO PROBLEMS.    Past Surgical History  Procedure Laterality Date  . Joint replacement  08/12/2011    LEFT TOTAL KNEE ARTHROPLASTY  . Eye surgery  2011    BILATERAL CATARACT EXTRACTION  . Abdominal hysterectomy  2000  . Lipomas removed from arms and abdomen    . Breast surgery  1982     CYST  REMOVED LEFT BREAST  . Resection of ovaries  1964    FOR OVULATION PURPOSE  . Appendectomy    . Cardiac catheterization      APPROXIMATELY 6 YRS AGO--TOLD MINIMAL BLOCKAGE-DONE AT [facility]  . Brain surgery  2006    TEMPORAL LOBECTOMY FOR RIGHT TEMPORAL ENCEPHALOCELE - PER OFFICE NOTES [facility]  . Total knee arthroplasty  02/16/2012    Procedure: TOTAL KNEE ARTHROPLASTY;  Surgeon: Javier Docker, MD;  Location: [facility];  Service: Orthopedics;  Laterality: Right;    History reviewed. No pertinent family history.  History  Substance Use Topics  . Smoking status: Former Games developer  . Smokeless tobacco: Not on file     Comment: 1996  . Alcohol Use: Yes     Comment: OCCAS    OB History   Grav Para Term Preterm Abortions TAB SAB Ect Mult Living                  Review of Systems  Constitutional: Negative for activity change.  HENT: Positive for facial swelling. Negative for neck pain.   Respiratory: Negative for cough, shortness of breath and wheezing.   Cardiovascular: Negative for chest pain.  Gastrointestinal: Negative for nausea, vomiting, abdominal pain, diarrhea, constipation, blood in stool and abdominal distention.  Genitourinary:  Negative for hematuria and difficulty urinating.  Musculoskeletal: Positive for myalgias, joint swelling and arthralgias.  Skin: Negative for color change.  Neurological: Negative for speech difficulty.  Hematological: Does not bruise/bleed easily.  Psychiatric/Behavioral: Negative for confusion.    Allergies  Review of patient's allergies indicates no known allergies.  Home Medications   Current Outpatient Rx  Name  Route  Sig  Dispense  Refill  . albuterol (PROVENTIL HFA;VENTOLIN HFA) 108 (90 BASE) MCG/ACT inhaler   Inhalation   Inhale 2 puffs into the lungs every 6 (six) hours as needed (wheezing).          . Ascorbic Acid (VITAMIN C) 1000 MG tablet   Oral   Take 1,000 mg by mouth every evening.           Marland Kitchen aspirin 325 MG tablet   Oral   Take 325 mg by mouth every morning.         . Cholecalciferol (VITAMIN D-3) 5000 UNITS TABS   Oral   Take 5,000 Units by mouth every evening.          . cyanocobalamin (,VITAMIN B-12,) 1000 MCG/ML injection   Intramuscular   Inject 1,000 mcg into the muscle every 30 (thirty) days.         . indapamide (LOZOL) 1.25 MG tablet   Oral   Take 1.25 mg by mouth every morning.         Marland Kitchen levothyroxine (SYNTHROID, LEVOTHROID) 125 MCG tablet   Oral   Take 125 mcg by mouth daily before breakfast.         . omega-3 acid ethyl esters (LOVAZA) 1 G capsule   Oral   Take 2 g by mouth daily.         . pramipexole (MIRAPEX) 0.5 MG tablet   Oral   Take 1-1.5 mg by mouth 2 (two) times daily. Takes 1.5mg  every morning and 1.0mg  every evening         . rosuvastatin (CRESTOR) 10 MG tablet   Oral   Take 10 mg by mouth at bedtime.         . Telmisartan-Amlodipine (TWYNSTA) 80-5 MG TABS   Oral   Take 1 tablet by mouth daily before breakfast.           BP 151/63  Pulse 68  Temp(Src) 98 F (36.7 C) (Oral)  Resp 19  SpO2 91%  Physical Exam  Nursing note and vitals reviewed. Constitutional: She is oriented to person, place, and time. She appears well-developed and well-nourished.  HENT:  Head: Normocephalic and atraumatic.  No midline c-spine tenderness, pt able to turn head to 45 degrees bilaterally without any pain and able to flex neck to the chest and extend without any pain or neurologic symptoms.  Eyes: EOM are normal. Pupils are equal, round, and reactive to light.  Neck: Neck supple.  Cardiovascular: Normal rate, regular rhythm and normal heart sounds.   No murmur heard. Pulmonary/Chest: Effort normal. No respiratory distress.  Abdominal: Soft. She exhibits no distension. There is no tenderness. There is no rebound and no guarding.  Musculoskeletal:  Head to toe evaluation shows no hematoma, bleeding of the scalp, no facial  abrasions, step offs, crepitus, Pt does have abrasion and lip laceration - but no through laceration. No loose dentition.  No tenderness to palpation of the bilateral lower extremities, no gross deformities, no chest tenderness, no pelvic pain.  Pt does have tenderness over the right shoulder  Neurological: She is alert and  oriented to person, place, and time.  Skin: Skin is warm and dry.    ED Course  Procedures (including critical care time)  Labs Reviewed  BASIC METABOLIC PANEL - Abnormal; Notable for the following:    Glucose, Bld 112 (*)    BUN 24 (*)    Creatinine, Ser 1.18 (*)    GFR calc non Af Amer 44 (*)    GFR calc Af Amer 51 (*)    All other components within normal limits  CBC WITH DIFFERENTIAL  TROPONIN I  URINALYSIS, ROUTINE W REFLEX MICROSCOPIC   Dg Shoulder Right  12/28/2012  *RADIOLOGY REPORT*  Clinical Data: Fall, shoulder pain  RIGHT SHOULDER - 2+ VIEW  Comparison: None.  Findings: Three views of the right shoulder submitted.  There is displaced subcapital fracture of the proximal right humerus.  IMPRESSION:  Displaced fracture of proximal right humerus.   Original Report Authenticated By: Natasha Mead, M.D.    Ct Head Wo Contrast  12/28/2012  *RADIOLOGY REPORT*  Clinical Data: Lip laceration following a fall.  CT HEAD WITHOUT CONTRAST  Technique:  Contiguous axial images were obtained from the base of the skull through the vertex without contrast.  Comparison: None.  Findings: Mildly enlarged subarachnoid spaces.  Normal size and position of the ventricles.  No skull fracture, intracranial hemorrhage or paranasal sinus air-fluid levels.  IMPRESSION: No acute abnormality.  Mild atrophy.   Original Report Authenticated By: Beckie Salts, M.D.    Dg Knee Complete 4 Views Right  12/28/2012  *RADIOLOGY REPORT*  Clinical Data: Fall, shoulder pain  RIGHT KNEE - COMPLETE 4+ VIEW  Comparison: 02/16/2012  Findings: Four views of the right knee submitted.  No acute fracture or  subluxation.  There is right hip prosthesis anatomic alignment.  No evidence of prosthesis loosening.  IMPRESSION: No acute fracture or subluxation.  Right knee prosthesis in anatomic alignment.   Original Report Authenticated By: Natasha Mead, M.D.      No diagnosis found.    MDM  DDx includes: - Mechanical falls - ICH - Fractures - Contusions - Soft tissue injury  Pt comes in with cc of fall. Pt has proximal humeral fracture. Neurovascularly intact. Knee Xray is normal and CT head is fine as well.  D/C case with Dr. Cornelius Moras - who is requesting sling immobilization and f/u with shoulder specialist early part of next week. His office will call to make an appt - we will give contact info as well.  Pt's pain is significant, and i will send her home with percocets. I have advocated aggressive icing. She is to return to the ED if the pain is not tolerated well with pain meds.   Derwood Kaplan, MD 12/28/12 2233

## 2012-12-28 NOTE — ED Notes (Signed)
Pt states she did not see curb outside a store and fell on R shoulder.  No LOC, no blood thinners, denies hitting head, neck/back pain.  Pt states her R shoulder hurts as well as her R knee.  Pt bit lip during fall and has some bleeding on bottom lip.  Also has abrasion on R ankle.  EMS started 20 ga and gave fentanyl.  Pt able to bend knee.  No deformities noted.  Pt awake and alert

## 2012-12-28 NOTE — ED Notes (Signed)
Returned back from CT. 

## 2012-12-28 NOTE — ED Notes (Signed)
MD at bedside. 

## 2012-12-28 NOTE — ED Notes (Signed)
ZOX:[id number]<AV> Expected date:12/28/12<BR> Expected time: 6:27 PM<BR> Means of arrival:Ambulance<BR> Comments:<BR> fall

## 2013-01-02 ENCOUNTER — Other Ambulatory Visit: Payer: Self-pay | Admitting: Physician Assistant

## 2013-01-02 NOTE — H&P (Signed)
Monique Hudson is an 78 y.o. female.    Chief Complaint: right shoulder pain  HPI: Pt is a 78 y.o. female complaining of right shoulder pain s/p fall and fracture of humerus. Risks, benefits and expectations were discussed with the patient. Patient understand the risks, benefits and expectations and wishes to proceed with surgery.   PCP:  No primary provider on file.  D/C Plans:  Home with HHPT  PMH: Past Medical History  Diagnosis Date  . Asthma   . Hypertension   . Heart murmur   . Restless leg syndrome   . Hypothyroidism   . Arthritis     PAIN AND ARTHRITITS RIGHT KNEE  . Shortness of breath     WITH EXERTION  . Bronchitis     HX OF CHRONIC BRONCHITIS--NO RECENT PROBLEMS  . Sleep apnea     NOT USING CPAP - IS USING OXYGEN 2 L PER NASAL CANNULA WHEN SLEEPING  . Claustrophobia   . Complication of anesthesia     PT REPORTS PROBLEMS WITH HER OXYGEN SATURATION AFTER ONE OF HER PAST SURGERIES.  PT STATES SPINAL FOR MOST RECENT LEFT TKA ON 08/12/11 AT [facility] AND NO PROBLEMS.    PSH: Past Surgical History  Procedure Laterality Date  . Joint replacement  08/12/2011    LEFT TOTAL KNEE ARTHROPLASTY  . Eye surgery  2011    BILATERAL CATARACT EXTRACTION  . Abdominal hysterectomy  2000  . Lipomas removed from arms and abdomen    . Breast surgery  1982     CYST REMOVED LEFT BREAST  . Resection of ovaries  1964    FOR OVULATION PURPOSE  . Appendectomy    . Cardiac catheterization      APPROXIMATELY 6 YRS AGO--TOLD MINIMAL BLOCKAGE-DONE AT [facility]  . Brain surgery  2006    TEMPORAL LOBECTOMY FOR RIGHT TEMPORAL ENCEPHALOCELE - PER OFFICE NOTES [facility]  . Total knee arthroplasty  02/16/2012    Procedure: TOTAL KNEE ARTHROPLASTY;  Surgeon: Jeffrey C Beane, MD;  Location: [facility];  Service: Orthopedics;  Laterality: Right;    Social History:  reports that she has quit smoking. She does not have any smokeless tobacco history on file. She reports that   drinks alcohol. She reports that she does not use illicit drugs.  Allergies:  No Known Allergies  Medications: Current Outpatient Prescriptions  Medication Sig Dispense Refill  . albuterol (PROVENTIL HFA;VENTOLIN HFA) 108 (90 BASE) MCG/ACT inhaler Inhale 2 puffs into the lungs every 6 (six) hours as needed (wheezing).       . Ascorbic Acid (VITAMIN C) 1000 MG tablet Take 1,000 mg by mouth every evening.       . aspirin 325 MG tablet Take 325 mg by mouth every morning.      . Cholecalciferol (VITAMIN D-3) 5000 UNITS TABS Take 5,000 Units by mouth every evening.       . cyanocobalamin (,VITAMIN B-12,) 1000 MCG/ML injection Inject 1,000 mcg into the muscle every 30 (thirty) days.      . indapamide (LOZOL) 1.25 MG tablet Take 1.25 mg by mouth every morning.      . levothyroxine (SYNTHROID, LEVOTHROID) 125 MCG tablet Take 125 mcg by mouth daily before breakfast.      . omega-3 acid ethyl esters (LOVAZA) 1 G capsule Take 2 g by mouth daily.      . oxyCODONE-acetaminophen (PERCOCET/ROXICET) 5-325 MG per tablet Take 1 tablet by mouth every 4 (four) hours as needed for pain.    20 tablet  0  . pramipexole (MIRAPEX) 0.5 MG tablet Take 1-1.5 mg by mouth 2 (two) times daily. Takes 1.5mg every morning and 1.0mg every evening      . rosuvastatin (CRESTOR) 10 MG tablet Take 10 mg by mouth at bedtime.      . Telmisartan-Amlodipine (TWYNSTA) 80-5 MG TABS Take 1 tablet by mouth daily before breakfast.       No current facility-administered medications for this visit.    No results found for this or any previous visit (from the past 48 hour(s)). No results found.  ROS: Pain with rom of the right upper extremity Moderate pain  Mild swelling to right arm  Physical Exam:  Alert and oriented 78 y.o. female in no acute distress Cranial nerves 2-12 intact Cervical spine: full rom with no tenderness, nv intact distally Chest: active breath sounds bilaterally, no wheeze rhonchi or rales Heart: regular rate  and rhythm, no murmur Abd: non tender non distended with active bowel sounds Hip is stable with rom  Right shoulder in sling nv intact distally No rom due to known fracture right upper extremity  Assessment/Plan Assessment: right proximal humerus fracture  Plan: Patient will undergo a right proximal humerus ORIF vs hemi arthroplasty by Dr. Norris at [place]. Risks benefits and expectations were discussed with the patient. Patient understand risks, benefits and expectations and wishes to proceed.   

## 2013-01-03 MED ORDER — CEFAZOLIN SODIUM-DEXTROSE 2-3 GM-% IV SOLR
2.0000 g | INTRAVENOUS | Status: AC
  Administered 2013-01-04: 2 g via INTRAVENOUS
  Filled 2013-01-03 (×2): qty 50

## 2013-01-04 ENCOUNTER — Encounter (HOSPITAL_COMMUNITY)
Admission: RE | Disposition: A | Discharge status: Home / Self Care | Payer: Self-pay | Source: Ambulatory Visit | Attending: Orthopedic Surgery

## 2013-01-04 ENCOUNTER — Observation Stay (HOSPITAL_COMMUNITY)
Admission: RE | Admit: 2013-01-04 | Discharge: 2013-01-06 | Disposition: A | Discharge status: Home / Self Care | Payer: Medicare Other | Source: Ambulatory Visit | Attending: Orthopedic Surgery | Admitting: Orthopedic Surgery

## 2013-01-04 ENCOUNTER — Ambulatory Visit (HOSPITAL_COMMUNITY): Payer: Medicare Other | Admitting: Anesthesiology

## 2013-01-04 ENCOUNTER — Encounter (HOSPITAL_COMMUNITY): Payer: Self-pay | Admitting: Anesthesiology

## 2013-01-04 ENCOUNTER — Encounter (HOSPITAL_COMMUNITY): Payer: Self-pay | Admitting: *Deleted

## 2013-01-04 DIAGNOSIS — S42293A Other displaced fracture of upper end of unspecified humerus, initial encounter for closed fracture: Principal | ICD-10-CM | POA: Insufficient documentation

## 2013-01-04 DIAGNOSIS — G4733 Obstructive sleep apnea (adult) (pediatric): Secondary | ICD-10-CM | POA: Insufficient documentation

## 2013-01-04 DIAGNOSIS — Z79899 Other long term (current) drug therapy: Secondary | ICD-10-CM | POA: Insufficient documentation

## 2013-01-04 DIAGNOSIS — I10 Essential (primary) hypertension: Secondary | ICD-10-CM | POA: Insufficient documentation

## 2013-01-04 DIAGNOSIS — W19XXXA Unspecified fall, initial encounter: Secondary | ICD-10-CM | POA: Insufficient documentation

## 2013-01-04 HISTORY — PX: ORIF HUMERUS FRACTURE: SHX2126

## 2013-01-04 LAB — SURGICAL PCR SCREEN: Staphylococcus aureus: NEGATIVE

## 2013-01-04 LAB — CBC
Hemoglobin: 12.1 g/dL (ref 12.0–15.0)
MCH: 30.6 pg (ref 26.0–34.0)
MCHC: 33.8 g/dL (ref 30.0–36.0)

## 2013-01-04 LAB — CREATININE, SERUM: Creatinine, Ser: 1.05 mg/dL (ref 0.50–1.10)

## 2013-01-04 SURGERY — OPEN REDUCTION INTERNAL FIXATION (ORIF) PROXIMAL HUMERUS FRACTURE
Anesthesia: General | Site: Shoulder | Laterality: Right | Wound class: Clean

## 2013-01-04 MED ORDER — MIDAZOLAM HCL 5 MG/ML IJ SOLN: 1.0000 mg | Freq: Once | INTRAMUSCULAR | Status: DC

## 2013-01-04 MED ORDER — PRAMIPEXOLE DIHYDROCHLORIDE 1 MG PO TABS: 1.0000 mg | ORAL_TABLET | Freq: Two times a day (BID) | ORAL | Status: DC

## 2013-01-04 MED ORDER — VITAMIN D3 25 MCG (1000 UNIT) PO TABS
5000.0000 [IU] | ORAL_TABLET | Freq: Every evening | ORAL | Status: DC
  Administered 2013-01-04 – 2013-01-05 (×2): 5000 [IU] via ORAL
  Filled 2013-01-04 (×3): qty 5

## 2013-01-04 MED ORDER — 0.9 % SODIUM CHLORIDE (POUR BTL) OPTIME
TOPICAL | Status: DC | PRN
  Administered 2013-01-04: 1000 mL

## 2013-01-04 MED ORDER — PRAMIPEXOLE DIHYDROCHLORIDE 1 MG PO TABS
1.0000 mg | ORAL_TABLET | Freq: Every evening | ORAL | Status: DC
  Administered 2013-01-04 – 2013-01-05 (×2): 1 mg via ORAL
  Filled 2013-01-04 (×3): qty 1

## 2013-01-04 MED ORDER — FENTANYL CITRATE 0.05 MG/ML IJ SOLN
50.0000 ug | Freq: Once | INTRAMUSCULAR | Status: AC
  Administered 2013-01-04: 50 ug via INTRAVENOUS

## 2013-01-04 MED ORDER — AMLODIPINE BESYLATE 5 MG PO TABS
5.0000 mg | ORAL_TABLET | Freq: Every day | ORAL | Status: DC
  Administered 2013-01-05 – 2013-01-06 (×2): 5 mg via ORAL
  Filled 2013-01-04 (×2): qty 1

## 2013-01-04 MED ORDER — METOCLOPRAMIDE HCL 5 MG/ML IJ SOLN: 5.0000 mg | Freq: Three times a day (TID) | INTRAMUSCULAR | Status: DC | PRN

## 2013-01-04 MED ORDER — MUPIROCIN 2 % EX OINT
TOPICAL_OINTMENT | CUTANEOUS | Status: AC
  Administered 2013-01-04: 1
  Filled 2013-01-04: qty 22

## 2013-01-04 MED ORDER — PROPOFOL 10 MG/ML IV BOLUS
INTRAVENOUS | Status: DC | PRN
  Administered 2013-01-04: 160 mg via INTRAVENOUS

## 2013-01-04 MED ORDER — PRAMIPEXOLE DIHYDROCHLORIDE 1.5 MG PO TABS
1.5000 mg | ORAL_TABLET | Freq: Every morning | ORAL | Status: DC
  Administered 2013-01-05 – 2013-01-06 (×2): 1.5 mg via ORAL
  Filled 2013-01-04 (×2): qty 1

## 2013-01-04 MED ORDER — FENTANYL CITRATE 0.05 MG/ML IJ SOLN: 50.0000 ug | Freq: Once | INTRAMUSCULAR | Status: DC

## 2013-01-04 MED ORDER — PROMETHAZINE HCL 25 MG/ML IJ SOLN: 6.2500 mg | INTRAMUSCULAR | Status: DC | PRN

## 2013-01-04 MED ORDER — ACETAMINOPHEN 650 MG RE SUPP: 650.0000 mg | Freq: Four times a day (QID) | RECTAL | Status: DC | PRN

## 2013-01-04 MED ORDER — FENTANYL CITRATE 0.05 MG/ML IJ SOLN
INTRAMUSCULAR | Status: DC | PRN
  Administered 2013-01-04: 100 ug via INTRAVENOUS

## 2013-01-04 MED ORDER — NEOSTIGMINE METHYLSULFATE 1 MG/ML IJ SOLN
INTRAMUSCULAR | Status: DC | PRN
  Administered 2013-01-04: 4 mg via INTRAVENOUS

## 2013-01-04 MED ORDER — ALBUTEROL SULFATE HFA 108 (90 BASE) MCG/ACT IN AERS
2.0000 | INHALATION_SPRAY | Freq: Four times a day (QID) | RESPIRATORY_TRACT | Status: DC | PRN
  Filled 2013-01-04: qty 6.7

## 2013-01-04 MED ORDER — PHENYLEPHRINE HCL 10 MG/ML IJ SOLN
INTRAMUSCULAR | Status: DC | PRN
  Administered 2013-01-04 (×2): 120 ug via INTRAVENOUS

## 2013-01-04 MED ORDER — LACTATED RINGERS IV SOLN
INTRAVENOUS | Status: DC | PRN
  Administered 2013-01-04: 15:00:00 via INTRAVENOUS

## 2013-01-04 MED ORDER — MORPHINE SULFATE 4 MG/ML IJ SOLN
3.0000 mg | INTRAMUSCULAR | Status: DC | PRN
  Administered 2013-01-05: 1 mg via INTRAVENOUS
  Administered 2013-01-05: 3 mg via INTRAVENOUS
  Filled 2013-01-04 (×2): qty 1

## 2013-01-04 MED ORDER — MIDAZOLAM HCL 2 MG/2ML IJ SOLN: 1.0000 mg | INTRAMUSCULAR | Status: DC | PRN

## 2013-01-04 MED ORDER — POTASSIUM CHLORIDE IN NACL 20-0.9 MEQ/L-% IV SOLN
INTRAVENOUS | Status: DC
  Administered 2013-01-04 – 2013-01-05 (×2): via INTRAVENOUS
  Filled 2013-01-04 (×3): qty 1000

## 2013-01-04 MED ORDER — ASPIRIN 325 MG PO TABS
325.0000 mg | ORAL_TABLET | Freq: Every morning | ORAL | Status: DC
  Administered 2013-01-05 – 2013-01-06 (×2): 325 mg via ORAL
  Filled 2013-01-04 (×2): qty 1

## 2013-01-04 MED ORDER — MIDAZOLAM HCL 2 MG/2ML IJ SOLN
INTRAMUSCULAR | Status: AC
  Administered 2013-01-04: 1 mg
  Filled 2013-01-04: qty 2

## 2013-01-04 MED ORDER — CEFAZOLIN SODIUM-DEXTROSE 2-3 GM-% IV SOLR
2.0000 g | Freq: Four times a day (QID) | INTRAVENOUS | Status: AC
  Administered 2013-01-04 – 2013-01-05 (×3): 2 g via INTRAVENOUS
  Filled 2013-01-04 (×3): qty 50

## 2013-01-04 MED ORDER — CYANOCOBALAMIN 1000 MCG/ML IJ SOLN: 1000.0000 ug | INTRAMUSCULAR | Status: DC

## 2013-01-04 MED ORDER — SODIUM CHLORIDE 0.9 % IV SOLN
10.0000 mg | INTRAVENOUS | Status: DC | PRN
  Administered 2013-01-04: 20 ug/min via INTRAVENOUS

## 2013-01-04 MED ORDER — ROCURONIUM BROMIDE 100 MG/10ML IV SOLN
INTRAVENOUS | Status: DC | PRN
  Administered 2013-01-04: 30 mg via INTRAVENOUS

## 2013-01-04 MED ORDER — ATORVASTATIN CALCIUM 20 MG PO TABS
20.0000 mg | ORAL_TABLET | Freq: Every day | ORAL | Status: DC
  Administered 2013-01-05: 20 mg via ORAL
  Filled 2013-01-04 (×2): qty 1

## 2013-01-04 MED ORDER — LEVOTHYROXINE SODIUM 125 MCG PO TABS
125.0000 ug | ORAL_TABLET | Freq: Every day | ORAL | Status: DC
  Administered 2013-01-05 – 2013-01-06 (×2): 125 ug via ORAL
  Filled 2013-01-04 (×4): qty 1

## 2013-01-04 MED ORDER — INDAPAMIDE 1.25 MG PO TABS
1.2500 mg | ORAL_TABLET | Freq: Every day | ORAL | Status: DC
  Administered 2013-01-05 – 2013-01-06 (×2): 1.25 mg via ORAL
  Filled 2013-01-04 (×3): qty 1

## 2013-01-04 MED ORDER — METHOCARBAMOL 500 MG PO TABS
500.0000 mg | ORAL_TABLET | Freq: Four times a day (QID) | ORAL | Status: DC | PRN
  Administered 2013-01-04 – 2013-01-06 (×5): 500 mg via ORAL
  Filled 2013-01-04 (×6): qty 1

## 2013-01-04 MED ORDER — GLYCOPYRROLATE 0.2 MG/ML IJ SOLN
INTRAMUSCULAR | Status: DC | PRN
  Administered 2013-01-04: .6 mg via INTRAVENOUS

## 2013-01-04 MED ORDER — CHLORHEXIDINE GLUCONATE 4 % EX LIQD: 60.0000 mL | Freq: Once | CUTANEOUS | Status: DC

## 2013-01-04 MED ORDER — PHENOL 1.4 % MT LIQD: 1.0000 | OROMUCOSAL | Status: DC | PRN

## 2013-01-04 MED ORDER — VITAMIN C 500 MG PO TABS
1000.0000 mg | ORAL_TABLET | Freq: Every evening | ORAL | Status: DC
  Administered 2013-01-04 – 2013-01-05 (×2): 1000 mg via ORAL
  Filled 2013-01-04 (×3): qty 2

## 2013-01-04 MED ORDER — METOCLOPRAMIDE HCL 10 MG PO TABS: 5.0000 mg | ORAL_TABLET | Freq: Three times a day (TID) | ORAL | Status: DC | PRN

## 2013-01-04 MED ORDER — ONDANSETRON HCL 4 MG PO TABS: 4.0000 mg | ORAL_TABLET | Freq: Four times a day (QID) | ORAL | Status: DC | PRN

## 2013-01-04 MED ORDER — FENTANYL CITRATE 0.05 MG/ML IJ SOLN
INTRAMUSCULAR | Status: AC
  Filled 2013-01-04: qty 2

## 2013-01-04 MED ORDER — OMEGA-3-ACID ETHYL ESTERS 1 G PO CAPS
2.0000 g | ORAL_CAPSULE | Freq: Every day | ORAL | Status: DC
  Administered 2013-01-04 – 2013-01-06 (×3): 2 g via ORAL
  Filled 2013-01-04 (×3): qty 2

## 2013-01-04 MED ORDER — MUPIROCIN 2 % EX OINT
TOPICAL_OINTMENT | CUTANEOUS | Status: AC
  Filled 2013-01-04: qty 22

## 2013-01-04 MED ORDER — IRBESARTAN 300 MG PO TABS
300.0000 mg | ORAL_TABLET | Freq: Every day | ORAL | Status: DC
  Administered 2013-01-05 – 2013-01-06 (×2): 300 mg via ORAL
  Filled 2013-01-04 (×2): qty 1

## 2013-01-04 MED ORDER — MENTHOL 3 MG MT LOZG: 1.0000 | LOZENGE | OROMUCOSAL | Status: DC | PRN

## 2013-01-04 MED ORDER — HYDROMORPHONE HCL PF 1 MG/ML IJ SOLN
0.2500 mg | INTRAMUSCULAR | Status: DC | PRN
  Administered 2013-01-04 (×3): 0.5 mg via INTRAVENOUS

## 2013-01-04 MED ORDER — LACTATED RINGERS IV SOLN
INTRAVENOUS | Status: DC
  Administered 2013-01-04: 15:00:00 via INTRAVENOUS

## 2013-01-04 MED ORDER — HYDROMORPHONE HCL PF 1 MG/ML IJ SOLN
INTRAMUSCULAR | Status: AC
  Filled 2013-01-04: qty 1

## 2013-01-04 MED ORDER — ONDANSETRON HCL 4 MG/2ML IJ SOLN
INTRAMUSCULAR | Status: DC | PRN
  Administered 2013-01-04: 4 mg via INTRAVENOUS

## 2013-01-04 MED ORDER — METHOCARBAMOL 100 MG/ML IJ SOLN
500.0000 mg | Freq: Four times a day (QID) | INTRAMUSCULAR | Status: DC | PRN
  Administered 2013-01-04: 500 mg via INTRAVENOUS
  Filled 2013-01-04: qty 5

## 2013-01-04 MED ORDER — ENOXAPARIN SODIUM 40 MG/0.4ML ~~LOC~~ SOLN
40.0000 mg | SUBCUTANEOUS | Status: DC
  Administered 2013-01-05 – 2013-01-06 (×2): 40 mg via SUBCUTANEOUS
  Filled 2013-01-04 (×3): qty 0.4

## 2013-01-04 MED ORDER — OXYCODONE-ACETAMINOPHEN 5-325 MG PO TABS
1.0000 | ORAL_TABLET | ORAL | Status: DC | PRN
  Administered 2013-01-04 – 2013-01-06 (×8): 1 via ORAL
  Filled 2013-01-04 (×8): qty 1

## 2013-01-04 MED ORDER — BUPIVACAINE-EPINEPHRINE PF 0.25-1:200000 % IJ SOLN
INTRAMUSCULAR | Status: DC | PRN
  Administered 2013-01-04: 9 mL

## 2013-01-04 MED ORDER — TELMISARTAN-AMLODIPINE 80-5 MG PO TABS: 1.0000 | ORAL_TABLET | Freq: Every day | ORAL | Status: DC

## 2013-01-04 MED ORDER — LIDOCAINE HCL (CARDIAC) 20 MG/ML IV SOLN
INTRAVENOUS | Status: DC | PRN
  Administered 2013-01-04: 80 mg via INTRAVENOUS

## 2013-01-04 MED ORDER — ONDANSETRON HCL 4 MG/2ML IJ SOLN: 4.0000 mg | Freq: Four times a day (QID) | INTRAMUSCULAR | Status: DC | PRN

## 2013-01-04 MED ORDER — ACETAMINOPHEN 325 MG PO TABS: 650.0000 mg | ORAL_TABLET | Freq: Four times a day (QID) | ORAL | Status: DC | PRN

## 2013-01-04 SURGICAL SUPPLY — 59 items
BIT DRILL 4 LONG FAST STEP (BIT) ×2 IMPLANT
BIT DRILL 4 SHORT FAST STEP (BIT) ×2 IMPLANT
BIT DRILL SNP 4.0MM (BIT) ×1 IMPLANT
CLOTH BEACON ORANGE TIMEOUT ST (SAFETY) ×2 IMPLANT
CLSR STERI-STRIP ANTIMIC 1/2X4 (GAUZE/BANDAGES/DRESSINGS) ×2 IMPLANT
DRAPE INCISE IOBAN 66X45 STRL (DRAPES) ×2 IMPLANT
DRAPE U-SHAPE 47X51 STRL (DRAPES) ×2 IMPLANT
DRILL BIT SNP 4.0MM (BIT) ×1
DRSG ADAPTIC 3X8 NADH LF (GAUZE/BANDAGES/DRESSINGS) ×2 IMPLANT
DRSG EMULSION OIL 3X3 NADH (GAUZE/BANDAGES/DRESSINGS) ×2 IMPLANT
DRSG PAD ABDOMINAL 8X10 ST (GAUZE/BANDAGES/DRESSINGS) ×2 IMPLANT
DURAPREP 26ML APPLICATOR (WOUND CARE) ×2 IMPLANT
ELECT REM PT RETURN 9FT ADLT (ELECTROSURGICAL) ×2
ELECTRODE REM PT RTRN 9FT ADLT (ELECTROSURGICAL) ×1 IMPLANT
GLOVE BIOGEL PI ORTHO PRO 7.5 (GLOVE) ×1
GLOVE BIOGEL PI ORTHO PRO SZ8 (GLOVE) ×1
GLOVE ORTHO TXT STRL SZ7.5 (GLOVE) ×2 IMPLANT
GLOVE PI ORTHO PRO STRL 7.5 (GLOVE) ×1 IMPLANT
GLOVE PI ORTHO PRO STRL SZ8 (GLOVE) ×1 IMPLANT
GLOVE SURG ORTHO 8.5 STRL (GLOVE) ×4 IMPLANT
GOWN STRL NON-REIN LRG LVL3 (GOWN DISPOSABLE) ×4 IMPLANT
GOWN STRL REIN XL XLG (GOWN DISPOSABLE) ×4 IMPLANT
KIT BASIN OR (CUSTOM PROCEDURE TRAY) ×2 IMPLANT
KIT ROOM TURNOVER OR (KITS) ×2 IMPLANT
MANIFOLD NEPTUNE II (INSTRUMENTS) ×2 IMPLANT
NDL SUT 6 .5 CRC .975X.05 MAYO (NEEDLE) ×1 IMPLANT
NEEDLE 22X1 1/2 (OR ONLY) (NEEDLE) IMPLANT
NEEDLE MAYO TAPER (NEEDLE) ×1
NS IRRIG 1000ML POUR BTL (IV SOLUTION) ×2 IMPLANT
PACK SHOULDER (CUSTOM PROCEDURE TRAY) ×2 IMPLANT
PAD ARMBOARD 7.5X6 YLW CONV (MISCELLANEOUS) ×4 IMPLANT
PASSER SUT SWANSON 36MM LOOP (INSTRUMENTS) IMPLANT
PEG STND 4.0X35MM (Orthopedic Implant) ×6 IMPLANT
PEG STND 4.0X37.5MM (Orthopedic Implant) ×2 IMPLANT
PEG STND 4.0X40MM (Orthopedic Implant) ×2 IMPLANT
PEGSTD 4.0X35MM (Orthopedic Implant) ×3 IMPLANT
PEGSTD 4.0X37.5MM (Orthopedic Implant) ×1 IMPLANT
PEGSTD 4.0X40MM (Orthopedic Implant) ×1 IMPLANT
PIN GUIDE SHOULDER 2.0MM (PIN) ×2 IMPLANT
PLATE SZ3 SHOULDER NAIL SNP (Plate) ×2 IMPLANT
SCREW SNP UNICORTICAL (Screw) ×6 IMPLANT
SPONGE GAUZE 4X4 12PLY (GAUZE/BANDAGES/DRESSINGS) ×2 IMPLANT
SPONGE LAP 4X18 X RAY DECT (DISPOSABLE) ×4 IMPLANT
STAPLER VISISTAT 35W (STAPLE) ×2 IMPLANT
STRIP CLOSURE SKIN 1/2X4 (GAUZE/BANDAGES/DRESSINGS) ×2 IMPLANT
SUCTION FRAZIER TIP 10 FR DISP (SUCTIONS) ×2 IMPLANT
SUT FIBERWIRE #2 38 T-5 BLUE (SUTURE) ×2
SUT MNCRL AB 4-0 PS2 18 (SUTURE) ×2 IMPLANT
SUT VIC AB 0 CT1 27 (SUTURE) ×1
SUT VIC AB 0 CT1 27XBRD ANBCTR (SUTURE) ×1 IMPLANT
SUT VIC AB 2-0 CT1 27 (SUTURE) ×1
SUT VIC AB 2-0 CT1 TAPERPNT 27 (SUTURE) ×1 IMPLANT
SUTURE FIBERWR #2 38 T-5 BLUE (SUTURE) ×1 IMPLANT
SYR CONTROL 10ML LL (SYRINGE) ×2 IMPLANT
TAPE CLOTH SURG 6X10 WHT LF (GAUZE/BANDAGES/DRESSINGS) ×2 IMPLANT
TOWEL OR 17X24 6PK STRL BLUE (TOWEL DISPOSABLE) ×2 IMPLANT
TOWEL OR 17X26 10 PK STRL BLUE (TOWEL DISPOSABLE) ×2 IMPLANT
WATER STERILE IRR 1000ML POUR (IV SOLUTION) ×2 IMPLANT
YANKAUER SUCT BULB TIP NO VENT (SUCTIONS) ×2 IMPLANT

## 2013-01-04 NOTE — Anesthesia Preprocedure Evaluation (Addendum)
Anesthesia Evaluation  Patient identified by MRN, date of birth, ID band Patient awake    Reviewed: Allergy & Precautions, H&P , NPO status , Patient's Chart, lab work & pertinent test results  History of Anesthesia Complications (+) AWARENESS UNDER ANESTHESIANegative for: history of anesthetic complications  Airway Mallampati: II TM Distance: >3 FB Neck ROM: Full    Dental  (+) Edentulous Upper and Partial Lower   Pulmonary shortness of breath, asthma , sleep apnea (noncompiant with CPAP) , former smoker,  breath sounds clear to auscultation  Pulmonary exam normal       Cardiovascular hypertension, negative cardio ROS  + Valvular Problems/Murmurs Rhythm:Regular Rate:Normal     Neuro/Psych Anxiety negative neurological ROS     GI/Hepatic negative GI ROS, Neg liver ROS,   Endo/Other  Hypothyroidism Morbid obesity  Renal/GU negative Renal ROS  negative genitourinary   Musculoskeletal negative musculoskeletal ROS (+)   Abdominal (+) + obese,   Peds negative pediatric ROS (+)  Hematology negative hematology ROS (+)   Anesthesia Other Findings   Reproductive/Obstetrics negative OB ROS                          Anesthesia Physical Anesthesia Plan  ASA: III  Anesthesia Plan: General   Post-op Pain Management:    Induction: Intravenous  Airway Management Planned: Oral ETT and Video Laryngoscope Planned  Additional Equipment:   Intra-op Plan:   Post-operative Plan: Extubation in OR  Informed Consent: I have reviewed the patients History and Physical, chart, labs and discussed the procedure including the risks, benefits and alternatives for the proposed anesthesia with the patient or authorized representative who has indicated his/her understanding and acceptance.     Plan Discussed with: CRNA and Surgeon  Anesthesia Plan Comments:         Anesthesia Quick Evaluation

## 2013-01-04 NOTE — Anesthesia Postprocedure Evaluation (Signed)
  Anesthesia Post-op Note  Patient: Monique Hudson  Procedure(s) Performed: Procedure(s): OPEN REDUCTION INTERNAL FIXATION (ORIF) PROXIMAL HUMERUS FRACTURE (Right)  Patient Location: PACU  Anesthesia Type:General  Level of Consciousness: awake, oriented, sedated and patient cooperative  Airway and Oxygen Therapy: Patient Spontanous Breathing  Post-op Pain: mild  Post-op Assessment: Post-op Vital signs reviewed, Patient's Cardiovascular Status Stable, Respiratory Function Stable, Patent Airway, No signs of Nausea or vomiting and Pain level controlled  Post-op Vital Signs: stable  Complications: No apparent anesthesia complications

## 2013-01-04 NOTE — Transfer of Care (Signed)
Immediate Anesthesia Transfer of Care Note  Patient: Monique Hudson  Procedure(s) Performed: Procedure(s): OPEN REDUCTION INTERNAL FIXATION (ORIF) PROXIMAL HUMERUS FRACTURE (Right)  Patient Location: PACU  Anesthesia Type:General  Level of Consciousness: awake  Airway & Oxygen Therapy: Patient Spontanous Breathing and Patient connected to face mask oxygen  Post-op Assessment: Report given to PACU RN and Post -op Vital signs reviewed and stable  Post vital signs: Reviewed and stable  Complications: No apparent anesthesia complications

## 2013-01-04 NOTE — H&P (View-Only) (Signed)
Monique Hudson is an 78 y.o. female.    Chief Complaint: right shoulder pain  HPI: Pt is a 78 y.o. female complaining of right shoulder pain s/p fall and fracture of humerus. Risks, benefits and expectations were discussed with the patient. Patient understand the risks, benefits and expectations and wishes to proceed with surgery.   PCP:  No primary provider on file.  D/C Plans:  Home with HHPT  PMH: Past Medical History  Diagnosis Date  . Asthma   . Hypertension   . Heart murmur   . Restless leg syndrome   . Hypothyroidism   . Arthritis     PAIN AND ARTHRITITS RIGHT KNEE  . Shortness of breath     WITH EXERTION  . Bronchitis     HX OF CHRONIC BRONCHITIS--NO RECENT PROBLEMS  . Sleep apnea     NOT USING CPAP - IS USING OXYGEN 2 L PER NASAL CANNULA WHEN SLEEPING  . Claustrophobia   . Complication of anesthesia     PT REPORTS PROBLEMS WITH HER OXYGEN SATURATION AFTER ONE OF HER PAST SURGERIES.  PT STATES SPINAL FOR MOST RECENT LEFT TKA ON 08/12/11 AT [facility] AND NO PROBLEMS.    PSH: Past Surgical History  Procedure Laterality Date  . Joint replacement  08/12/2011    LEFT TOTAL KNEE ARTHROPLASTY  . Eye surgery  2011    BILATERAL CATARACT EXTRACTION  . Abdominal hysterectomy  2000  . Lipomas removed from arms and abdomen    . Breast surgery  1982     CYST REMOVED LEFT BREAST  . Resection of ovaries  1964    FOR OVULATION PURPOSE  . Appendectomy    . Cardiac catheterization      APPROXIMATELY 6 YRS AGO--TOLD MINIMAL BLOCKAGE-DONE AT [facility]  . Brain surgery  2006    TEMPORAL LOBECTOMY FOR RIGHT TEMPORAL ENCEPHALOCELE - PER OFFICE NOTES [facility]  . Total knee arthroplasty  02/16/2012    Procedure: TOTAL KNEE ARTHROPLASTY;  Surgeon: Javier Docker, MD;  Location: [facility];  Service: Orthopedics;  Laterality: Right;    Social History:  reports that she has quit smoking. She does not have any smokeless tobacco history on file. She reports that   drinks alcohol. She reports that she does not use illicit drugs.  Allergies:  No Known Allergies  Medications: Current Outpatient Prescriptions  Medication Sig Dispense Refill  . albuterol (PROVENTIL HFA;VENTOLIN HFA) 108 (90 BASE) MCG/ACT inhaler Inhale 2 puffs into the lungs every 6 (six) hours as needed (wheezing).       . Ascorbic Acid (VITAMIN C) 1000 MG tablet Take 1,000 mg by mouth every evening.       Marland Kitchen aspirin 325 MG tablet Take 325 mg by mouth every morning.      . Cholecalciferol (VITAMIN D-3) 5000 UNITS TABS Take 5,000 Units by mouth every evening.       . cyanocobalamin (,VITAMIN B-12,) 1000 MCG/ML injection Inject 1,000 mcg into the muscle every 30 (thirty) days.      . indapamide (LOZOL) 1.25 MG tablet Take 1.25 mg by mouth every morning.      Marland Kitchen levothyroxine (SYNTHROID, LEVOTHROID) 125 MCG tablet Take 125 mcg by mouth daily before breakfast.      . omega-3 acid ethyl esters (LOVAZA) 1 G capsule Take 2 g by mouth daily.      Marland Kitchen oxyCODONE-acetaminophen (PERCOCET/ROXICET) 5-325 MG per tablet Take 1 tablet by mouth every 4 (four) hours as needed for pain.  20 tablet  0  . pramipexole (MIRAPEX) 0.5 MG tablet Take 1-1.5 mg by mouth 2 (two) times daily. Takes 1.5mg  every morning and 1.0mg  every evening      . rosuvastatin (CRESTOR) 10 MG tablet Take 10 mg by mouth at bedtime.      . Telmisartan-Amlodipine (TWYNSTA) 80-5 MG TABS Take 1 tablet by mouth daily before breakfast.       No current facility-administered medications for this visit.    No results found for this or any previous visit (from the past 48 hour(s)). No results found.  ROS: Pain with rom of the right upper extremity Moderate pain  Mild swelling to right arm  Physical Exam:  Alert and oriented 78 y.o. female in no acute distress Cranial nerves 2-12 intact Cervical spine: full rom with no tenderness, nv intact distally Chest: active breath sounds bilaterally, no wheeze rhonchi or rales Heart: regular rate  and rhythm, no murmur Abd: non tender non distended with active bowel sounds Hip is stable with rom  Right shoulder in sling nv intact distally No rom due to known fracture right upper extremity  Assessment/Plan Assessment: right proximal humerus fracture  Plan: Patient will undergo a right proximal humerus ORIF vs hemi arthroplasty by Dr. Ranell Patrick at [facility]. Risks benefits and expectations were discussed with the patient. Patient understand risks, benefits and expectations and wishes to proceed.

## 2013-01-04 NOTE — Preoperative (Signed)
Beta Blockers   Reason not to administer Beta Blockers:Not Applicable 

## 2013-01-04 NOTE — Interval H&P Note (Signed)
History and Physical Interval Note:  01/04/2013 2:56 PM  Monique Hudson  has presented today for surgery, with the diagnosis of Right Shoulder Humerous Fracture  The various methods of treatment have been discussed with the patient and family. After consideration of risks, benefits and other options for treatment, the patient has consented to  Procedure(s): OPEN REDUCTION INTERNAL FIXATION (ORIF) PROXIMAL HUMERUS FRACTURE VS HEMI ARTHROPLASTY (Right) as a surgical intervention .  The patient's history has been reviewed, patient examined, no change in status, stable for surgery.  I have reviewed the patient's chart and labs.  Questions were answered to the patient's satisfaction.     Anshi Jalloh,STEVEN R

## 2013-01-04 NOTE — Anesthesia Procedure Notes (Signed)
Procedure Name: Intubation Date/Time: 01/04/2013 3:49 PM Performed by: Quentin Ore Pre-anesthesia Checklist: Patient identified, Emergency Drugs available, Suction available, Patient being monitored and Timeout performed Patient Re-evaluated:Patient Re-evaluated prior to inductionOxygen Delivery Method: Circle system utilized Preoxygenation: Pre-oxygenation with 100% oxygen Intubation Type: IV induction Ventilation: Mask ventilation without difficulty Grade View: Grade I Tube type: Oral Tube size: 7.0 mm Number of attempts: 1 Airway Equipment and Method: Video-laryngoscopy and Stylet Placement Confirmation: ETT inserted through vocal cords under direct vision,  positive ETCO2 and breath sounds checked- equal and bilateral Secured at: 21 cm Tube secured with: Tape Dental Injury: Teeth and Oropharynx as per pre-operative assessment  Comments: Glidescope electively used per [facility] request.

## 2013-01-04 NOTE — Brief Op Note (Signed)
01/04/2013  5:11 PM  PATIENT:  Monique Hudson  78 y.o. female  PRE-OPERATIVE DIAGNOSIS:  Right Shoulder Proximal Humerus Fracture, Displaced  POST-OPERATIVE DIAGNOSIS:  Right Shoulder Proximal Humerus Fracture,Displaced  PROCEDURE:  Procedure(s): OPEN REDUCTION INTERNAL FIXATION (ORIF) PROXIMAL HUMERUS FRACTURE VS HEMI ARTHROPLASTY (Right) DePuy SNP  SURGEON:  Surgeon(s) and Role:    * Verlee Rossetti, MD - Primary  PHYSICIAN ASSISTANT:   ASSISTANTS: Donnie Coffin. Dixon, PA-C   ANESTHESIA:   regional and general  EBL:     BLOOD ADMINISTERED:none  DRAINS: none   LOCAL MEDICATIONS USED:  Marcaine  SPECIMEN:  No Specimen  DISPOSITION OF SPECIMEN:  N/A  COUNTS:  YES  TOURNIQUET:  * No tourniquets in log *  DICTATION: .Other Dictation: Dictation Number [id number]  PLAN OF CARE: Admit for overnight observation  PATIENT DISPOSITION:  PACU - hemodynamically stable.   Delay start of Pharmacological VTE agent (>24hrs) due to surgical blood loss or risk of bleeding: no

## 2013-01-05 LAB — BASIC METABOLIC PANEL
CO2: 27 mEq/L (ref 19–32)
Chloride: 97 mEq/L (ref 96–112)
Creatinine, Ser: 1.06 mg/dL (ref 0.50–1.10)
Sodium: 135 mEq/L (ref 135–145)

## 2013-01-05 LAB — HEMOGLOBIN AND HEMATOCRIT, BLOOD: HCT: 33.7 % — ABNORMAL LOW (ref 36.0–46.0)

## 2013-01-05 NOTE — Progress Notes (Signed)
Utilization review completed.  

## 2013-01-05 NOTE — Plan of Care (Signed)
Problem: Phase II Progression Outcomes Goal: Discharge plan established Recommend no follow up OT at this time

## 2013-01-05 NOTE — Progress Notes (Signed)
    Subjective: 1 Day Post-Op Procedure(s) (LRB): OPEN REDUCTION INTERNAL FIXATION (ORIF) PROXIMAL HUMERUS FRACTURE (Right) Patient reports pain as 3 on 0-10 scale.   Denies CP or SOB.  Voiding without difficulty. Positive flatus. Objective: Vital signs in last 24 hours: Temp:  [97.1 F (36.2 C)-97.9 F (36.6 C)] 97.4 F (36.3 C) (03/01 0706) Pulse Rate:  [60-100] 60 (03/01 0706) Resp:  [12-23] 18 (03/01 0706) BP: (127-162)/(49-101) 128/64 mmHg (03/01 1011) SpO2:  [94 %-100 %] 99 % (03/01 0706) Weight:  [105.101 kg (231 lb 11.3 oz)] 105.101 kg (231 lb 11.3 oz) (02/28 1343)  Intake/Output from previous day: 02/28 0701 - 03/01 0700 In: 1580 [P.O.:480; I.V.:1100] Out: 300 [Urine:200; Blood:100] Intake/Output this shift:    Labs:  Recent Labs  01/04/13 2129 01/05/13 0650  HGB 12.1 11.1*    Recent Labs  01/04/13 2129 01/05/13 0650  WBC 9.4  --   RBC 3.96  --   HCT 35.8* 33.7*  PLT 184  --     Recent Labs  01/04/13 2129 01/05/13 0650  NA  --  135  K  --  4.4  CL  --  97  CO2  --  27  BUN  --  28*  CREATININE 1.05 1.06  GLUCOSE  --  121*  CALCIUM  --  9.7   No results found for this basename: LABPT, INR,  in the last 72 hours  Physical Exam: Neurologically intact ABD soft Intact pulses distally Incision: dressing C/D/I and no drainage Compartment soft  Assessment/Plan: 1 Day Post-Op Procedure(s) (LRB): OPEN REDUCTION INTERNAL FIXATION (ORIF) PROXIMAL HUMERUS FRACTURE (Right) Advance diet Up with therapy  Sutton Hirsch D for Dr. Venita Lick [facility] [phone] 01/05/2013, 12:29 PM

## 2013-01-05 NOTE — Discharge Summary (Addendum)
Physician Discharge Summary   Patient ID: Monique Hudson MRN: [id number] DOB/AGE: Jun 17, 1935 78 y.o.  Admit date: 01/04/2013 Discharge date: 01/05/2013  Admission Diagnoses:  Right shoulder proximal humerus fracture, displaced  Discharge Diagnoses:  Same   Surgeries: Procedure(s): OPEN REDUCTION INTERNAL FIXATION (ORIF) PROXIMAL HUMERUS FRACTURE on 01/04/2013   Consultants: OT  Discharged Condition: Stable  Hospital Course: Monique Hudson is an 78 y.o. female who was admitted 01/04/2013 with a chief complaint of right shoulder pain, and found to have a diagnosis of right shoulder displaced proximal humerus fracture.  They were brought to the operating room on 01/04/2013 and underwent the above named procedures.    The patient had an uncomplicated hospital course and was stable for discharge.  Recent vital signs:  Filed Vitals:   01/05/13 0706  BP: 150/58  Pulse: 60  Temp: 97.4 F (36.3 C)  Resp: 18    Recent laboratory studies:  Results for orders placed during the hospital encounter of 01/04/13  SURGICAL PCR SCREEN      Result Value Range   MRSA, PCR NEGATIVE  NEGATIVE   Staphylococcus aureus NEGATIVE  NEGATIVE  CBC      Result Value Range   WBC 9.4  4.0 - 10.5 K/uL   RBC 3.96  3.87 - 5.11 MIL/uL   Hemoglobin 12.1  12.0 - 15.0 g/dL   HCT 40.9 (*) 81.1 - 91.4 %   MCV 90.4  78.0 - 100.0 fL   MCH 30.6  26.0 - 34.0 pg   MCHC 33.8  30.0 - 36.0 g/dL   RDW 78.2  95.6 - 21.3 %   Platelets 184  150 - 400 K/uL  CREATININE, SERUM      Result Value Range   Creatinine, Ser 1.05  0.50 - 1.10 mg/dL   GFR calc non Af Amer 51 (*) >90 mL/min   GFR calc Af Amer 59 (*) >90 mL/min  HEMOGLOBIN AND HEMATOCRIT, BLOOD      Result Value Range   Hemoglobin 11.1 (*) 12.0 - 15.0 g/dL   HCT 08.6 (*) 57.8 - 46.9 %    Discharge Medications:     Medication List    TAKE these medications       albuterol 108 (90 BASE) MCG/ACT inhaler  Commonly known as:  PROVENTIL HFA;VENTOLIN  HFA  Inhale 2 puffs into the lungs every 6 (six) hours as needed (wheezing).     aspirin 325 MG tablet  Take 325 mg by mouth every morning.     cyanocobalamin 1000 MCG/ML injection  Commonly known as:  (VITAMIN B-12)  Inject 1,000 mcg into the muscle every 30 (thirty) days.     indapamide 1.25 MG tablet  Commonly known as:  LOZOL  Take 1.25 mg by mouth every morning.     levothyroxine 125 MCG tablet  Commonly known as:  SYNTHROID, LEVOTHROID  Take 125 mcg by mouth daily before breakfast.     omega-3 acid ethyl esters 1 G capsule  Commonly known as:  LOVAZA  Take 2 g by mouth daily.     oxyCODONE-acetaminophen 5-325 MG per tablet  Commonly known as:  PERCOCET/ROXICET  Take 1 tablet by mouth every 4 (four) hours as needed for pain.     pramipexole 0.5 MG tablet  Commonly known as:  MIRAPEX  Take 1-1.5 mg by mouth 2 (two) times daily. Takes 1.5mg  every morning and 1.0mg  every evening     rosuvastatin 10 MG tablet  Commonly known as:  CRESTOR  Take 10 mg by mouth at bedtime.     TWYNSTA 80-5 MG Tabs  Generic drug:  Telmisartan-Amlodipine  Take 1 tablet by mouth daily before breakfast.     vitamin C 1000 MG tablet  Take 1,000 mg by mouth every evening.     Vitamin D-3 5000 UNITS Tabs  Take 5,000 Units by mouth every evening.        Diagnostic Studies: Dg Shoulder Right  12/28/2012  *RADIOLOGY REPORT*  Clinical Data: Fall, shoulder pain  RIGHT SHOULDER - 2+ VIEW  Comparison: None.  Findings: Three views of the right shoulder submitted.  There is displaced subcapital fracture of the proximal right humerus.  IMPRESSION:  Displaced fracture of proximal right humerus.   Original Report Authenticated By: Natasha Mead, M.D.    Ct Head Wo Contrast  12/28/2012  *RADIOLOGY REPORT*  Clinical Data: Lip laceration following a fall.  CT HEAD WITHOUT CONTRAST  Technique:  Contiguous axial images were obtained from the base of the skull through the vertex without contrast.  Comparison:  None.  Findings: Mildly enlarged subarachnoid spaces.  Normal size and position of the ventricles.  No skull fracture, intracranial hemorrhage or paranasal sinus air-fluid levels.  IMPRESSION: No acute abnormality.  Mild atrophy.   Original Report Authenticated By: Beckie Salts, M.D.    Dg Knee Complete 4 Views Right  12/28/2012  *RADIOLOGY REPORT*  Clinical Data: Fall, shoulder pain  RIGHT KNEE - COMPLETE 4+ VIEW  Comparison: 02/16/2012  Findings: Four views of the right knee submitted.  No acute fracture or subluxation.  There is right hip prosthesis anatomic alignment.  No evidence of prosthesis loosening.  IMPRESSION: No acute fracture or subluxation.  Right knee prosthesis in anatomic alignment.   Original Report Authenticated By: Natasha Mead, M.D.     Disposition: 01-Home or Self Care        Follow-up Information   Follow up with Kijana Estock,STEVEN R, MD. Call in 2 weeks. [phone])    Contact information:   [address], [address] [place] [address] [phone]        Signed: Verlee Rossetti 01/05/2013, 8:32 AM  D/C date is 3/2. Stable this AM for D/C

## 2013-01-05 NOTE — Progress Notes (Signed)
Pt states she does not want to wear CPAP tonight. She would prefer to only wear the [place]. RN aware. RT will continue to monitor.

## 2013-01-05 NOTE — Evaluation (Addendum)
Occupational Therapy Evaluation Patient Details Name: Monique Hudson MRN: [id number] DOB: Aug 06, 1935 Today's Date: 01/05/2013 Time: 4098-1191 OT Time Calculation (min): 61 min  OT Assessment / Plan / Recommendation Clinical Impression  Pt referred to OT services following R humeral fracture with ORIF. Pt is at min A level with ADLs, sup with ADL/functional mobility. All education completed and no further OT services needed at this time    OT Assessment  Patient does not need any further OT services;Progress rehab of shoulder as ordered by MD at follow-up appointment    Follow Up Recommendations  No OT follow up;Outpatient OT;Other (comment) (outpatient OT when appropriate per MD)    Barriers to Discharge  None    Equipment Recommendations  None recommended by OT    Recommendations for Other Services    Frequency       Precautions / Restrictions Precautions Precautions: Shoulder Type of Shoulder Precautions: no rotation, lapslides only Precaution Booklet Issued: Yes (comment) Required Braces or Orthoses: Other Brace/Splint Other Brace/Splint: R UE sling to be worn at all time, except during ADLs and lapslides Restrictions Weight Bearing Restrictions: Yes RUE Weight Bearing: Non weight bearing Other Position/Activity Restrictions: Position with pillows as instructed during slep in bed or recliner Pt needs to lean to side for cleaning under armpit       ADL  Eating/Feeding: Performed;Set up Where Assessed - Eating/Feeding: Chair Grooming: Performed;Supervision/safety Where Assessed - Grooming: Supported standing Upper Body Bathing: Simulated;Minimal assistance, pt and family instructed on leaning to side for cleaning under arm Lower Body Bathing: Simulated;Minimal assistance Upper Body Dressing: Performed;Minimal assistance Lower Body Dressing: Minimal assistance;Performed Toilet Transfer: Performed;Supervision/safety Toilet Transfer Method: Sit to stand;Other (comment)  (ambulating with no AD) Toilet Transfer Equipment: Regular height toilet Toileting - Clothing Manipulation and Hygiene: Performed;Minimal assistance Where Assessed - Glass blower/designer Manipulation and Hygiene: Standing;Sit on 3-in-1 or toilet Equipment Used: Reacher;Gait belt ADL Comments: Pt and family educated on use of reacher and toileting aid for use at home    OT Diagnosis:    OT Problem List:   OT Treatment Interventions:     OT Goals    Visit Information  Last OT Received On: 01/05/13    Subjective Data  Subjective: " I am feeling much better " Patient Stated Goal: To return home   Prior Functioning     Home Living Lives With: Spouse Available Help at Discharge: Family;Available 24 hours/day Type of Home: House Home Access: Stairs to enter [employer] of Steps: 3 Home Layout: One level Bathroom Toilet: Standard Bathroom Accessibility: Yes How Accessible: Accessible via walker Home Adaptive Equipment: Bedside commode/3-in-1;Walker - rolling;Straight cane;Shower chair with back Prior Function Level of Independence: Needs assistance Needs Assistance: Bathing;Dressing Bath: Moderate Dressing: Moderate Able to Take Stairs?: Yes Driving: Yes Vocation: Retired Musician: No difficulties Dominant Hand: Right         Vision/Perception Vision - History Baseline Vision: Wears glasses only for reading Patient Visual Report: No change from baseline Perception Perception: Within Functional Limits   Cognition  Cognition Overall Cognitive Status: Appears within functional limits for tasks assessed/performed Arousal/Alertness: Awake/alert Orientation Level: Oriented X4 / Intact Behavior During Session: [facility] for tasks performed    Extremity/Trunk Assessment Right Upper Extremity Assessment RUE ROM/Strength/Tone: Deficits;Unable to fully assess;Due to pain;Due to precautions Left Upper Extremity Assessment LUE ROM/Strength/Tone:  [facility] for tasks assessed     Mobility Bed Mobility Bed Mobility: Supine to Sit;Sitting - Scoot to Edge of Bed Supine to Sit: 4: Min assist  Sitting - Scoot to [place] of Bed: 5: Supervision Transfers Transfers: Sit to Stand;Stand to Sit Sit to Stand: 5: Supervision Stand to Sit: 5: Supervision     Exercise General Exercises - Upper Extremity Shoulder Flexion: AROM;Other (comment);10 reps;Right (lapslides only) Shoulder Exercises Shoulder Flexion: Other (comment) (lapslides only) Donning/doffing shirt without moving shoulder: Caregiver independent with task;Minimal assistance Method for sponge bathing under operated UE: Minimal assistance;Caregiver independent with task Donning/doffing sling/immobilizer: Caregiver independent with task;Minimal assistance Correct positioning of sling/immobilizer: Caregiver independent with task;Minimal assistance Sling wearing schedule (on at all times/off for ADL's): Caregiver independent with task;Independent Proper positioning of operated UE when showering: Independent;Caregiver independent with task Positioning of UE while sleeping: Independent   Balance Balance Balance Assessed: No   End of Session OT - End of Session Activity Tolerance: Patient tolerated treatment well Patient left: in chair;with call bell/phone within reach;with family/visitor present  GO Functional Limitation: Self care Self Care Current Status (A2130): At least 1 percent but less than 20 percent impaired, limited or restricted Self Care Goal Status (Q6578): At least 1 percent but less than 20 percent impaired, limited or restricted Self Care Discharge Status [phone]): At least 1 percent but less than 20 percent impaired, limited or restricted   Galen Manila 01/05/2013, 12:49 PM

## 2013-01-05 NOTE — Op Note (Signed)
NAMEBERNICE, MCAULIFFE NO.:  [id number]  MEDICAL RECORD NO.:  [id number]  LOCATION:  5N23C                        FACILITY:  [facility]  PHYSICIAN:  Almedia Balls. Ranell Patrick, M.D. DATE OF BIRTH:  10/12/1935  DATE OF PROCEDURE:  01/04/2013 DATE OF DISCHARGE:                              OPERATIVE REPORT   PREOPERATIVE DIAGNOSIS:  Right displaced proximal humerus fracture.  POSTOPERATIVE DIAGNOSIS:  Right displaced proximal humerus fracture.  PROCEDURE PERFORMED:  Open reduction and internal fixation of right proximal humerus fracture using DePuy SNP.  ATTENDING SURGEON:  Almedia Balls. Ranell Patrick, M.D.  ASSISTANT:  Donnie Coffin. Dixon, PA-C, who scrubbed the entire procedure and necessary for satisfactory completion of surgery.  ANESTHESIA:  General anesthesia was used plus interscalene block.  ESTIMATED BLOOD LOSS:  Minimal.  FLUID REPLACEMENT:  1000 mL of crystalloid.  INSTRUMENT COUNTS:  Correct.  COMPLICATIONS:  There were no complications.  ANTIBIOTICS:  Perioperative antibiotics were given.  INDICATIONS:  The patient is a 78 year old female with a history of a fall injuring her right shoulder.  The patient suffered a displaced proximal humerus fracture.  The patient's pectoralis was pulling the patient's humeral shaft forward and the humeral head rolled posteriorly to where it was nearly 100% displaced.  Due to significant malalignment of the fracture and concern for progressive displacement and eventual poor function, we talked to the patient and she elected to proceed with open reduction and internal fixation versus hemiarthroplasty to stabilize the shoulder and realign it.  Informed consent was obtained.  DESCRIPTION OF PROCEDURE:  After adequate level of anesthesia was achieved, the patient was positioned in a modified beach-chair position. Right shoulder correctly identified, sterilely prepped and draped in its entirety in usual manner.  Time-out was called.  We  then entered the shoulder using standard approach.  This was a deltopectoral incision starting just lateral to the coracoid and extending distally about 7 cm. Dissection down through subcutaneous tissues, cephalic vein identified and taken laterally with the deltoid.  This was a fairly small deltopectoral incision, thus we did not have to take into the pec insertion.  We identified the conjoined tendon and retracted that as well.  The fracture hematoma was contained underneath the fascial layer and bursa of the shoulder.  Just made a small puncture using Cobb elevator and curved Mayo scissors just lateral to the biceps tendon at the fracture site that allowed me introduced a Cobb elevator, and then stick that back and pull that humeral head from behind to on top of the humerus again.  I then placed a long blue hand retractors like Richardson retractor just beyond the humeral head to hold the head in place.  I made a little hole with a rongeur just posterior to the bicipital groove in the distal humeral fractured shaft and then placed our DePuy SNP nail plate in the proper position.  Once in position, we felt like we had good head alignment.  I placed a little sleeve in the central of the drill holes for the pegs and then drilled a K-wire and thus we used for initial siting of where we were on the humeral head. We were actually very pleased with the alignment  of the fracture site, and of the relative height of the implant with regards to the humeral head, thus we went ahead and started with our 2 proximal most smooth pegs, which were locked into position in this locking plate.  We then re- imaged multiplanar C-arm to make sure these were in good position both on the AP and lateral plane.  Once they were verified, we removed our central guide pin.  We then placed our final 3 smooth pegs to the appropriate depth.  Next, we went ahead and addressed our unicortical screws down into the shaft  and we did that through a separate small incision.  We dissected bluntly down to the subcutaneous tissues and muscle down to bone, made sure that we were not on the axillary nerve, and we drilled those 3 holes and placed unicortical screws through the lateral cortex of the humerus and then the implant.  Once we were sure those were fully secured, we removed the insertion device.  There was felt to be a greater tuberosity fracture.  I went ahead and placed 2 mattress sutures, #2 FiberWire posterior to the tuberosity into the infraspinatus teres minor muscle, brought that around and sewed to the subscapularis thus gaining good control making sure that we did not have any displacement of the greater tuberosity.  Overall, we were pleased with the reduction of the fracture.  There was quite a bit of comminution even may be a partial head split up around the greater tuberosity area, looked like the tuberosity had some head on it, but the alignment looks sufficient that we felt like we should give this a go and try to get this to heal in and typically these patients do better from a range of motion standpoint and hemiarthroplasty and also from a strength standpoint.  The patient had her wounds fully irrigated.  We then closed the muscular layer with 0-Vicryl suture followed by 2-0 Vicryl for subcutaneous closure and 4-0 Monocryl for skin.  Steri-Strips applied followed by sterile dressing.  The patient tolerated the surgery well.     Almedia Balls. Ranell Patrick, M.D.     SRN/MEDQ  D:  01/04/2013  T:  01/05/2013  Job:  [id number]

## 2013-01-06 MED ORDER — METHOCARBAMOL 500 MG PO TABS: 500.0000 mg | ORAL_TABLET | Freq: Three times a day (TID) | ORAL | Status: DC | PRN

## 2013-01-06 MED ORDER — OXYCODONE-ACETAMINOPHEN 5-325 MG PO TABS: 1.0000 | ORAL_TABLET | ORAL | Status: DC | PRN

## 2013-01-06 NOTE — Progress Notes (Signed)
Orthopedics Progress Note  Subjective: Stable overnight.  Objective:  Filed Vitals:   01/06/13 0709  BP: 122/49  Pulse: 61  Temp: 98.3 F (36.8 C)  Resp: 18    General: Awake and alert  Musculoskeletal: right shoulder dressing clean and dry and intact, NVi, moderate swelling Neurovascularly intact  Lab Results  Component Value Date   WBC 9.4 01/04/2013   HGB 11.1* 01/05/2013   HCT 33.7* 01/05/2013   MCV 90.4 01/04/2013   PLT 184 01/04/2013       Component Value Date/Time   NA 135 01/05/2013 0650   K 4.4 01/05/2013 0650   CL 97 01/05/2013 0650   CO2 27 01/05/2013 0650   GLUCOSE 121* 01/05/2013 0650   BUN 28* 01/05/2013 0650   CREATININE 1.06 01/05/2013 0650   CALCIUM 9.7 01/05/2013 0650   GFRNONAA 50* 01/05/2013 0650   GFRAA 58* 01/05/2013 0650    Lab Results  Component Value Date   INR 0.98 02/08/2012   INR 0.99 08/05/2011    Assessment/Plan: POD #2 s/p Procedure(s): OPEN REDUCTION INTERNAL FIXATION (ORIF) PROXIMAL HUMERUS FRACTURE Stable for D/C today Rx on chart  Steven R. Ranell Patrick, MD 01/06/2013 8:44 AM

## 2013-01-07 ENCOUNTER — Encounter (HOSPITAL_COMMUNITY): Payer: Self-pay | Admitting: Orthopedic Surgery

## 2014-02-28 ENCOUNTER — Other Ambulatory Visit: Payer: Self-pay | Admitting: Orthopedic Surgery

## 2014-03-03 ENCOUNTER — Encounter (HOSPITAL_COMMUNITY): Payer: Self-pay | Admitting: Pharmacy Technician

## 2014-03-04 ENCOUNTER — Encounter (HOSPITAL_COMMUNITY)
Admission: RE | Admit: 2014-03-04 | Discharge: 2014-03-04 | Disposition: A | Discharge status: Home / Self Care | Payer: Medicare Other | Source: Ambulatory Visit | Attending: Specialist | Admitting: Specialist

## 2014-03-04 ENCOUNTER — Encounter (HOSPITAL_COMMUNITY): Payer: Self-pay

## 2014-03-04 ENCOUNTER — Ambulatory Visit (HOSPITAL_COMMUNITY)
Admission: RE | Admit: 2014-03-04 | Discharge: 2014-03-04 | Disposition: A | Discharge status: Home / Self Care | Payer: Medicare Other | Source: Ambulatory Visit | Attending: Anesthesiology | Admitting: Anesthesiology

## 2014-03-04 DIAGNOSIS — Z01812 Encounter for preprocedural laboratory examination: Secondary | ICD-10-CM | POA: Insufficient documentation

## 2014-03-04 DIAGNOSIS — Z01818 Encounter for other preprocedural examination: Secondary | ICD-10-CM | POA: Insufficient documentation

## 2014-03-04 DIAGNOSIS — Z01810 Encounter for preprocedural cardiovascular examination: Secondary | ICD-10-CM | POA: Insufficient documentation

## 2014-03-04 HISTORY — DX: Other allergy status, other than to drugs and biological substances: Z91.09

## 2014-03-04 HISTORY — DX: Pain, unspecified: R52

## 2014-03-04 LAB — BASIC METABOLIC PANEL
BUN: 31 mg/dL — ABNORMAL HIGH (ref 6–23)
CALCIUM: 10.1 mg/dL (ref 8.4–10.5)
CO2: 27 mEq/L (ref 19–32)
Chloride: 100 mEq/L (ref 96–112)
Creatinine, Ser: 1.36 mg/dL — ABNORMAL HIGH (ref 0.50–1.10)
GFR, EST AFRICAN AMERICAN: 43 mL/min — AB (ref >90)
GFR, EST NON AFRICAN AMERICAN: 37 mL/min — AB (ref >90)
Glucose, Bld: 135 mg/dL — ABNORMAL HIGH (ref 70–99)
Potassium: 4.7 mEq/L (ref 3.7–5.3)
SODIUM: 139 meq/L (ref 137–147)

## 2014-03-04 LAB — CBC
HCT: 39.5 % (ref 36.0–46.0)
Hemoglobin: 12.8 g/dL (ref 12.0–15.0)
MCH: 29.7 pg (ref 26.0–34.0)
MCHC: 32.4 g/dL (ref 30.0–36.0)
MCV: 91.6 fL (ref 78.0–100.0)
PLATELETS: 242 10*3/uL (ref 150–400)
RBC: 4.31 MIL/uL (ref 3.87–5.11)
RDW: 14.5 % (ref 11.5–15.5)
WBC: 6.9 10*3/uL (ref 4.0–10.5)

## 2014-03-04 NOTE — Patient Instructions (Addendum)
[facility]  ON:   Thursday  5/7  REPORT TO  SHORT STAY CENTER AT:  8:30 AM      PHONE # FOR SHORT STAY IS [phone]  DO NOT EAT OR DRINK ANYTHING AFTER MIDNIGHT THE NIGHT BEFORE YOUR SURGERY.  YOU MAY BRUSH YOUR TEETH, RINSE OUT YOUR MOUTH--BUT NO WATER, NO FOOD, NO CHEWING GUM, NO MINTS, NO CANDIES, NO CHEWING TOBACCO.  PLEASE TAKE THE FOLLOWING MEDICATIONS THE AM OF YOUR SURGERY WITH A FEW SIPS OF WATER:   AMLODIPINE, HYDROCODONE / ACETAMINOPHEN, LEVOTHYROXINE.   DO NOT BRING VALUABLES, MONEY, CREDIT CARDS.  DO NOT WEAR JEWELRY, MAKE-UP, NAIL POLISH AND NO METAL PINS OR CLIPS IN YOUR HAIR. CONTACT LENS, DENTURES / PARTIALS, GLASSES SHOULD NOT BE WORN TO SURGERY AND IN MOST CASES-HEARING AIDS WILL NEED TO BE REMOVED.  BRING YOUR GLASSES CASE, ANY EQUIPMENT NEEDED FOR YOUR CONTACT LENS. FOR PATIENTS ADMITTED TO THE HOSPITAL--CHECK OUT TIME THE DAY OF DISCHARGE IS 11:00 AM.  ALL INPATIENT ROOMS ARE PRIVATE - WITH BATHROOM, TELEPHONE, TELEVISION AND WIFI INTERNET.                                                    PLEASE READ OVER ANY  FACT SHEETS THAT YOU WERE GIVEN: MRSA INFORMATION, BLOOD TRANSFUSION INFORMATION, INCENTIVE SPIROMETER INFORMATION.  PLEASE BE AWARE THAT YOU MAY NEED ADDITIONAL BLOOD DRAWN DAY OF YOUR SURGERY  _______________________________________________________________________   [facility] - Preparing for Surgery Before surgery, you can play an important role.  Because skin is not sterile, your skin needs to be as free of germs as possible.  You can reduce the number of germs on your skin by washing with CHG (chlorahexidine gluconate) soap before surgery.  CHG is an antiseptic cleaner which kills germs and bonds with the skin to continue killing germs even after washing. Please DO NOT use if you have an allergy to CHG or antibacterial soaps.  If your skin becomes reddened/irritated stop using the CHG and inform your nurse  when you arrive at Short Stay. Do not shave (including legs and underarms) for at least 48 hours prior to the first CHG shower.  You may shave your face. Please follow these instructions carefully:  1.  Shower with CHG Soap the night before surgery and the  morning of Surgery.  2.  If you choose to wash your hair, wash your hair first as usual with your  normal  shampoo.  3.  After you shampoo, rinse your hair and body thoroughly to remove the  shampoo.                           4.  Use CHG as you would any other liquid soap.  You can apply chg directly  to the skin and wash                       Gently with a scrungie or clean washcloth.  5.  Apply the CHG Soap to your body ONLY FROM THE NECK DOWN.   Do not use on open                           Wound or open  sores. Avoid contact with eyes, ears mouth and genitals (private parts).                        Genitals (private parts) with your normal soap.             6.  Wash thoroughly, paying special attention to the area where your surgery  will be performed.  7.  Thoroughly rinse your body with warm water from the neck down.  8.  DO NOT shower/wash with your normal soap after using and rinsing off  the CHG Soap.                9.  Pat yourself dry with a clean towel.            10.  Wear clean pajamas.            11.  Place clean sheets on your bed the night of your first shower and do not  sleep with pets. Day of Surgery : Do not apply any lotions/deodorants the morning of surgery.  Please wear clean clothes to the hospital/surgery center.  FAILURE TO FOLLOW THESE INSTRUCTIONS MAY RESULT IN THE CANCELLATION OF YOUR SURGERY PATIENT SIGNATURE_________________________________  NURSE SIGNATURE__________________________________  ________________________________________________________________________   Monique MireIncentive Spirometer  An incentive spirometer is a tool that can help keep your lungs clear and active. This tool measures how well you are  filling your lungs with each breath. Taking long deep breaths may help reverse or decrease the chance of developing breathing (pulmonary) problems (especially infection) following:  A long period of time when you are unable to move or be active. BEFORE THE PROCEDURE   If the spirometer includes an indicator to show your best effort, your nurse or respiratory therapist will set it to a desired goal.  If possible, sit up straight or lean slightly forward. Try not to slouch.  Hold the incentive spirometer in an upright position. INSTRUCTIONS FOR USE  1. Sit on the edge of your bed if possible, or sit up as far as you can in bed or on a chair. 2. Hold the incentive spirometer in an upright position. 3. Breathe out normally. 4. Place the mouthpiece in your mouth and seal your lips tightly around it. 5. Breathe in slowly and as deeply as possible, raising the piston or the ball toward the top of the column. 6. Hold your breath for 3-5 seconds or for as long as possible. Allow the piston or ball to fall to the bottom of the column. 7. Remove the mouthpiece from your mouth and breathe out normally. 8. Rest for a few seconds and repeat Steps 1 through 7 at least 10 times every 1-2 hours when you are awake. Take your time and take a few normal breaths between deep breaths. 9. The spirometer may include an indicator to show your best effort. Use the indicator as a goal to work toward during each repetition. 10. After each set of 10 deep breaths, practice coughing to be sure your lungs are clear. If you have an incision (the cut made at the time of surgery), support your incision when coughing by placing a pillow or rolled up towels firmly against it. Once you are able to get out of bed, walk around indoors and cough well. You may stop using the incentive spirometer when instructed by your caregiver.  RISKS AND COMPLICATIONS  Take your time so you do not get dizzy or light-headed.  If  you are in pain,  you may need to take or ask for pain medication before doing incentive spirometry. It is harder to take a deep breath if you are having pain. AFTER USE  Rest and breathe slowly and easily.  It can be helpful to keep track of a log of your progress. Your caregiver can provide you with a simple table to help with this. If you are using the spirometer at home, follow these instructions: SEEK MEDICAL CARE IF:   You are having difficultly using the spirometer.  You have trouble using the spirometer as often as instructed.  Your pain medication is not giving enough relief while using the spirometer.  You develop fever of 100.5 F (38.1 C) or higher. SEEK IMMEDIATE MEDICAL CARE IF:   You cough up bloody sputum that had not been present before.  You develop fever of 102 F (38.9 C) or greater.  You develop worsening pain at or near the incision site. MAKE SURE YOU:   Understand these instructions.  Will watch your condition.  Will get help right away if you are not doing well or get worse. Document Released: 03/06/2007 Document Revised: 01/16/2012 Document Reviewed: 05/07/2007 [facility] Patient Information 2014 [place], [place].   ________________________________________________________________________

## 2014-03-04 NOTE — Pre-Procedure Instructions (Signed)
EKG AND CXR WERE DONE TODAY PREOP AT [facility] AS PER ANESTHESIOLOGIST'S GUIDELINES. 

## 2014-03-05 ENCOUNTER — Other Ambulatory Visit: Payer: Self-pay | Admitting: Orthopedic Surgery

## 2014-03-05 NOTE — H&P (Signed)
Monique Hudson is an 78 y.o. female.   Chief Complaint: left shoulder pain HPI: The patient is a 78 year old female who presents today for follow up of their shoulder. The patient is being followed for their left shoulder pain. They are 8.5 month(s) out from injury. Symptoms reported today include: pain. The patient feels that they are doing poorly and report their pain level to be 9 / 10. The following medication has been used for pain control: Hydrocodone. The patient has reported improvement of their symptoms with: Cortisone injections (helped for 3-4 weeks). She is 8.5 months s/p injury (August 2014) from lifting. She last saw Dr. Beane in December, to review her MRI with partial RCT, she was given a celestone injection which she states only worked for a few weeks (less than 4). The steroid injection in August also only gave her temporary relief. She has put off her follow up while getting some dental work done but her pain has continued to progress since her last appt and is now severe and intolerable. She is taking Norco prn, sleeping in a recliner. She reports pain even at rest, worse with motion of the shoulder, which is decreased, and it is also weak. Sometimes pain radiates down the entire arm when severe, but she denies any numbness or tingling. She is requesting new Rx for Norco.  Past Medical History  Diagnosis Date  . Hypertension   . Restless leg syndrome   . Hypothyroidism   . Arthritis     PAIN AND ARTHRITITS RIGHT KNEE  . Shortness of breath     WITH EXERTION  . Bronchitis     HX OF CHRONIC BRONCHITIS--NO RECENT PROBLEMS  . Sleep apnea     NOT USING CPAP - IS USING OXYGEN 2 L PER NASAL CANNULA WHEN SLEEPING  . Claustrophobia   . Complication of anesthesia     PT REPORTS PROBLEMS WITH HER OXYGEN SATURATION AFTER ONE OF HER PAST SURGERIES.  PT STATES SPINAL FOR MOST RECENT LEFT TKA ON 08/12/11 AT [facility] AND NO PROBLEMS.  . Heart murmur     DOESN'T CAUSE ANY PROBLEMS AND THE  DOCTORS DO NOT ALWAYS HEAR THE MURMUR  . Asthma     NO RECENT ASTHMA FLARE UPS  . Multiple environmental allergies   . Pain     LEFT SHOULDER - TORN ROTATOR CUFF    Past Surgical History  Procedure Laterality Date  . Eye surgery  2011    BILATERAL CATARACT EXTRACTION  . Abdominal hysterectomy  2000  . Lipomas removed from arms and abdomen    . Breast surgery  1982     CYST REMOVED LEFT BREAST  . Resection of ovaries  1964    FOR OVULATION PURPOSE  . Appendectomy    . Cardiac catheterization      APPROXIMATELY 6 YRS AGO--TOLD MINIMAL BLOCKAGE-DONE AT [facility]  . Total knee arthroplasty  02/16/2012    Procedure: TOTAL KNEE ARTHROPLASTY;  Surgeon: Jeffrey C Beane, MD;  Location: [facility];  Service: Orthopedics;  Laterality: Right;  . Orif humerus fracture Right 01/04/2013    Procedure: OPEN REDUCTION INTERNAL FIXATION (ORIF) PROXIMAL HUMERUS FRACTURE;  Surgeon: Steven R Norris, MD;  Location: [facility];  Service: Orthopedics;  Laterality: Right;  . Joint replacement  08/12/2011    LEFT TOTAL KNEE ARTHROPLASTY    No family history on file. Social History:  reports that she has quit smoking. She does not have any smokeless tobacco history on file.   She reports that she drinks alcohol. She reports that she does not use illicit drugs.  Allergies: No Known Allergies   (Not in a hospital admission)  Results for orders placed during the hospital encounter of 03/04/14 (from the past 48 hour(s))  BASIC METABOLIC PANEL     Status: Abnormal   Collection Time    03/04/14  1:15 PM      Result Value Ref Range   Sodium 139  137 - 147 mEq/L   Potassium 4.7  3.7 - 5.3 mEq/L   Chloride 100  96 - 112 mEq/L   CO2 27  19 - 32 mEq/L   Glucose, Bld 135 (*) 70 - 99 mg/dL   BUN 31 (*) 6 - 23 mg/dL   Creatinine, Ser 1.36 (*) 0.50 - 1.10 mg/dL   Calcium 10.1  8.4 - 10.5 mg/dL   GFR calc non Af Amer 37 (*) >90 mL/min   GFR calc Af Amer 43 (*) >90 mL/min   Comment: (NOTE)     The eGFR has been  calculated using the CKD EPI equation.     This calculation has not been validated in all clinical situations.     eGFR's persistently <90 mL/min signify possible Chronic Kidney     Disease.  CBC     Status: None   Collection Time    03/04/14  1:15 PM      Result Value Ref Range   WBC 6.9  4.0 - 10.5 K/uL   RBC 4.31  3.87 - 5.11 MIL/uL   Hemoglobin 12.8  12.0 - 15.0 g/dL   HCT 39.5  36.0 - 46.0 %   MCV 91.6  78.0 - 100.0 fL   MCH 29.7  26.0 - 34.0 pg   MCHC 32.4  30.0 - 36.0 g/dL   RDW 14.5  11.5 - 15.5 %   Platelets 242  150 - 400 K/uL   Dg Chest 2 View  03/04/2014   CLINICAL DATA:  Preop for left shoulder surgery.  Hypertension.  EXAM: CHEST  2 VIEW  COMPARISON:  03/11/2012  FINDINGS: Heart, mediastinum and hila are unremarkable.  The lungs are clear.  No pleural effusion or pneumothorax.  Right proximal humeral fracture has been reduced with an intra medullary rod and screws, new from the prior exam.  Bony thorax is demineralized but otherwise unremarkable.  IMPRESSION: No active cardiopulmonary disease.   Electronically Signed   By: David  Ormond M.D.   On: 03/04/2014 16:26    Review of Systems  Constitutional: Negative.   HENT: Negative.   Eyes: Negative.   Respiratory: Negative.   Cardiovascular: Negative.   Gastrointestinal: Negative.   Genitourinary: Negative.   Musculoskeletal: Positive for joint pain.  Skin: Negative.   Neurological: Negative.   Psychiatric/Behavioral: Negative.     There were no vitals taken for this visit. Physical Exam  Constitutional: She is oriented to person, place, and time. She appears well-developed and well-nourished.  HENT:  Head: Normocephalic and atraumatic.  Eyes: Conjunctivae and EOM are normal. Pupils are equal, round, and reactive to light.  Neck: Normal range of motion. Neck supple.  Respiratory: Effort normal and breath sounds normal.  GI: Soft. Bowel sounds are normal.  Musculoskeletal:  General Mental Status - Alert.  General Appearance- pleasant and In acute distress. Orientation- Oriented X3. Build & Nutrition- Obese.   Musculoskeletal Upper Extremity Left Upper Extremity: Left Shoulder: Inspection and Palpation:Tenderness- subacromial space tender to palpation. no tenderness to palpation of the AC joint,   no tenderness to palpation of the Morley joint and no tenderness to palpation of the clavicle. Swelling- none. Tissue tension/texture is - soft. Sensation- intact to light touch. Skin:Color- no ecchymosis and no erythema. ROM: Internal Rotation:AROM- severely decreased and painful. External Rotation:AROM- full. Flexion:AROM- mildly decreased and painful. Glenohumeral Abduction:AROM- mildly decreased and painful. Strength and Tone:Biceps- 5/5. Triceps- 5/5. Abduction- 4+/5 and painful. Internal Rotation- 5/5. External Rotation- 4+/5 and painful. Instability- sulcus sign negative. Impingement- impingement sign positive and secondary impingement sign positive. Deformities/Malalignments/Discrepancies- no deformities noted. Special Testing- Speed's test negative.  Neurological: She is alert and oriented to person, place, and time. She has normal reflexes.  Skin: Skin is warm and dry.  Psychiatric: She has a normal mood and affect.    prior xrays reviewed with type 2 acromion, decreased subacromial space.  MRI L shoulder images and report reviewed with high-grade interstitial tear supraspinatus, bursal surface, 38m x 660m  Assessment/Plan L shoulder RCT  Pt with ongoing L shoulder pain, decreased ROM, weakness, impingement symptoms, 7 months duration following lifting injury refractory to multiple steroid injections, HEP, activity modifications, relative rest, pain medication. MRI with partial thickness RCT, which at this point has likely progressed to >50% given her increased pain. We discussed relevant anatomy in detail. Discussed tx options at this point. Given her limited  relief with injections thus far, continued weakness, stiffness, impingement symptoms, recommend proceeding with surgery, which would be L shoulder mini-open SAD, RCR. We discussed the procedure itself as well as risks, complications, and alternatives including but not limited to DVT, PE, infx, bleeding, failure of procedure, need for secondary procedure, anesthesia risk, even stroke or death. Also discussed typical post-op protocols, need for PT, sling immobilization, activity limitations and modifications to avoid exacerbation. All questions were answered. Patient desires to proceed with surgery. Will obtain pre-op clearance from her PCP Dr. WiOlin Hausershe has an appt with him on 3/10. We also discussed her R shoulder stiffness and reviewed some ROM and strengthening exercises, gave her a theraband. We will proceed accordingly and schedule surgery once clearance is obtained. Refilled Norco. She will follow up 10-14 days post-op for suture removal.  I had a long discussion with the patient concerning the risks and benefits of a rotator cuff repair, including bleeding, infection, prolonged postoperative recovery, which may require 3 to 5 months until maximum medical improvement. Overight procedure with initiation of early passive range of motion within physical therapy. Avoid any active motion for the first six weeks. This is all in an effort to avoid recurrent tear of the rotator cuff and adhesive capsulitis. Return to work without use of the arm can be obtained following two weeks. However, driving will be a challenge. We also discussed the possibility of requiring implants including bone anchors,as well as an Allograft patch graft if a massive rotator cuff tear is encountered. Removal of any bones for spurs as well as bursitis will be performed during the procedure and also any associated anesthetic complications as well.  Plan L shoulder mini-open SAD, RCR  Whitleigh Garramone M. Ch[place] Howington for Dr. BeTonita Cong/29/2015,  1:16 PM

## 2014-03-13 ENCOUNTER — Encounter (HOSPITAL_COMMUNITY): Payer: Self-pay | Admitting: *Deleted

## 2014-03-13 ENCOUNTER — Ambulatory Visit (HOSPITAL_COMMUNITY): Payer: Medicare Other | Admitting: Anesthesiology

## 2014-03-13 ENCOUNTER — Ambulatory Visit (HOSPITAL_COMMUNITY)
Admission: RE | Admit: 2014-03-13 | Discharge: 2014-03-14 | Disposition: A | Discharge status: Home / Self Care | Payer: Medicare Other | Source: Ambulatory Visit | Attending: Specialist | Admitting: Specialist

## 2014-03-13 ENCOUNTER — Encounter (HOSPITAL_COMMUNITY): Payer: Medicare Other | Admitting: Anesthesiology

## 2014-03-13 ENCOUNTER — Encounter (HOSPITAL_COMMUNITY)
Admission: RE | Disposition: A | Discharge status: Home / Self Care | Payer: Self-pay | Source: Ambulatory Visit | Attending: Specialist

## 2014-03-13 DIAGNOSIS — R011 Cardiac murmur, unspecified: Secondary | ICD-10-CM | POA: Insufficient documentation

## 2014-03-13 DIAGNOSIS — Z87891 Personal history of nicotine dependence: Secondary | ICD-10-CM | POA: Insufficient documentation

## 2014-03-13 DIAGNOSIS — E039 Hypothyroidism, unspecified: Secondary | ICD-10-CM | POA: Insufficient documentation

## 2014-03-13 DIAGNOSIS — Z96659 Presence of unspecified artificial knee joint: Secondary | ICD-10-CM | POA: Insufficient documentation

## 2014-03-13 DIAGNOSIS — J45909 Unspecified asthma, uncomplicated: Secondary | ICD-10-CM | POA: Insufficient documentation

## 2014-03-13 DIAGNOSIS — G4730 Sleep apnea, unspecified: Secondary | ICD-10-CM | POA: Insufficient documentation

## 2014-03-13 DIAGNOSIS — S43429A Sprain of unspecified rotator cuff capsule, initial encounter: Secondary | ICD-10-CM | POA: Insufficient documentation

## 2014-03-13 DIAGNOSIS — M7580 Other shoulder lesions, unspecified shoulder: Secondary | ICD-10-CM

## 2014-03-13 DIAGNOSIS — I10 Essential (primary) hypertension: Secondary | ICD-10-CM | POA: Insufficient documentation

## 2014-03-13 DIAGNOSIS — Z90710 Acquired absence of both cervix and uterus: Secondary | ICD-10-CM | POA: Insufficient documentation

## 2014-03-13 DIAGNOSIS — M25819 Other specified joint disorders, unspecified shoulder: Secondary | ICD-10-CM | POA: Insufficient documentation

## 2014-03-13 DIAGNOSIS — X58XXXA Exposure to other specified factors, initial encounter: Secondary | ICD-10-CM | POA: Insufficient documentation

## 2014-03-13 DIAGNOSIS — M75102 Unspecified rotator cuff tear or rupture of left shoulder, not specified as traumatic: Secondary | ICD-10-CM | POA: Diagnosis present

## 2014-03-13 DIAGNOSIS — G2581 Restless legs syndrome: Secondary | ICD-10-CM | POA: Insufficient documentation

## 2014-03-13 DIAGNOSIS — Z9089 Acquired absence of other organs: Secondary | ICD-10-CM | POA: Insufficient documentation

## 2014-03-13 HISTORY — PX: SHOULDER OPEN ROTATOR CUFF REPAIR: SHX2407

## 2014-03-13 HISTORY — DX: Other specified postprocedural states: Z98.890

## 2014-03-13 HISTORY — DX: Nausea with vomiting, unspecified: R11.2

## 2014-03-13 LAB — CBC
HCT: 38.8 % (ref 36.0–46.0)
Hemoglobin: 12.5 g/dL (ref 12.0–15.0)
MCH: 29.9 pg (ref 26.0–34.0)
MCHC: 32.2 g/dL (ref 30.0–36.0)
MCV: 92.8 fL (ref 78.0–100.0)
PLATELETS: 161 10*3/uL (ref 150–400)
RBC: 4.18 MIL/uL (ref 3.87–5.11)
RDW: 14.5 % (ref 11.5–15.5)
WBC: 8.9 10*3/uL (ref 4.0–10.5)

## 2014-03-13 LAB — CREATININE, SERUM
Creatinine, Ser: 1.22 mg/dL — ABNORMAL HIGH (ref 0.50–1.10)
GFR calc Af Amer: 49 mL/min — ABNORMAL LOW (ref >90)
GFR calc non Af Amer: 42 mL/min — ABNORMAL LOW (ref >90)

## 2014-03-13 SURGERY — REPAIR, ROTATOR CUFF, OPEN
Anesthesia: General | Site: Shoulder | Laterality: Left

## 2014-03-13 MED ORDER — PRAMIPEXOLE DIHYDROCHLORIDE 1 MG PO TABS
1.0000 mg | ORAL_TABLET | Freq: Every day | ORAL | Status: DC
  Administered 2014-03-14: 1 mg via ORAL
  Filled 2014-03-13: qty 1

## 2014-03-13 MED ORDER — DEXAMETHASONE SODIUM PHOSPHATE 10 MG/ML IJ SOLN
INTRAMUSCULAR | Status: AC
  Filled 2014-03-13: qty 1

## 2014-03-13 MED ORDER — ONDANSETRON HCL 4 MG/2ML IJ SOLN
4.0000 mg | Freq: Four times a day (QID) | INTRAMUSCULAR | Status: DC | PRN
Start: 2014-03-13 — End: 2014-03-14

## 2014-03-13 MED ORDER — ONDANSETRON HCL 4 MG/2ML IJ SOLN
INTRAMUSCULAR | Status: DC | PRN
  Administered 2014-03-13: 4 mg via INTRAVENOUS

## 2014-03-13 MED ORDER — LACTATED RINGERS IV SOLN
INTRAVENOUS | Status: DC
  Administered 2014-03-13: 1000 mL via INTRAVENOUS
  Administered 2014-03-13: 10:00:00 via INTRAVENOUS

## 2014-03-13 MED ORDER — HYDROMORPHONE HCL PF 1 MG/ML IJ SOLN: 0.5000 mg | INTRAMUSCULAR | Status: DC | PRN

## 2014-03-13 MED ORDER — OXYCODONE-ACETAMINOPHEN 5-325 MG PO TABS: 1.0000 | ORAL_TABLET | ORAL | Status: DC | PRN

## 2014-03-13 MED ORDER — LEVOTHYROXINE SODIUM 137 MCG PO TABS
137.0000 ug | ORAL_TABLET | Freq: Every day | ORAL | Status: DC
  Administered 2014-03-14: 137 ug via ORAL
  Filled 2014-03-13 (×2): qty 1

## 2014-03-13 MED ORDER — METHOCARBAMOL 1000 MG/10ML IJ SOLN
500.0000 mg | Freq: Four times a day (QID) | INTRAVENOUS | Status: DC | PRN
  Filled 2014-03-13: qty 5

## 2014-03-13 MED ORDER — ROCURONIUM BROMIDE 100 MG/10ML IV SOLN
INTRAVENOUS | Status: DC | PRN
  Administered 2014-03-13: 25 mg via INTRAVENOUS

## 2014-03-13 MED ORDER — METOCLOPRAMIDE HCL 5 MG/ML IJ SOLN: 5.0000 mg | Freq: Three times a day (TID) | INTRAMUSCULAR | Status: DC | PRN

## 2014-03-13 MED ORDER — PHENOL 1.4 % MT LIQD
1.0000 | OROMUCOSAL | Status: DC | PRN
  Filled 2014-03-13: qty 177

## 2014-03-13 MED ORDER — HYDROCODONE-ACETAMINOPHEN 7.5-325 MG PO TABS: 1.0000 | ORAL_TABLET | ORAL | Status: AC | PRN

## 2014-03-13 MED ORDER — ACETAMINOPHEN 325 MG PO TABS: 650.0000 mg | ORAL_TABLET | Freq: Four times a day (QID) | ORAL | Status: DC | PRN

## 2014-03-13 MED ORDER — LIDOCAINE HCL (CARDIAC) 20 MG/ML IV SOLN
INTRAVENOUS | Status: AC
  Filled 2014-03-13: qty 5

## 2014-03-13 MED ORDER — SODIUM CHLORIDE 0.45 % IV SOLN
INTRAVENOUS | Status: DC
Start: 2014-03-13 — End: 2014-03-14
  Administered 2014-03-13: 14:00:00 via INTRAVENOUS

## 2014-03-13 MED ORDER — MEPERIDINE HCL 50 MG/ML IJ SOLN: 6.2500 mg | INTRAMUSCULAR | Status: DC | PRN

## 2014-03-13 MED ORDER — PRAMIPEXOLE DIHYDROCHLORIDE 0.25 MG PO TABS
0.5000 mg | ORAL_TABLET | Freq: Two times a day (BID) | ORAL | Status: DC
  Filled 2014-03-13 (×2): qty 4

## 2014-03-13 MED ORDER — ENOXAPARIN SODIUM 30 MG/0.3ML ~~LOC~~ SOLN
30.0000 mg | SUBCUTANEOUS | Status: DC
  Administered 2014-03-13: 30 mg via SUBCUTANEOUS
  Filled 2014-03-13 (×2): qty 0.3

## 2014-03-13 MED ORDER — OXYCODONE HCL 5 MG/5ML PO SOLN
5.0000 mg | Freq: Once | ORAL | Status: DC | PRN
  Filled 2014-03-13: qty 5

## 2014-03-13 MED ORDER — ONDANSETRON HCL 4 MG/2ML IJ SOLN
INTRAMUSCULAR | Status: AC
  Filled 2014-03-13: qty 2

## 2014-03-13 MED ORDER — METOCLOPRAMIDE HCL 5 MG/ML IJ SOLN
INTRAMUSCULAR | Status: DC | PRN
  Administered 2014-03-13: 10 mg via INTRAVENOUS

## 2014-03-13 MED ORDER — METHOCARBAMOL 500 MG PO TABS: 500.0000 mg | ORAL_TABLET | Freq: Four times a day (QID) | ORAL | Status: DC | PRN

## 2014-03-13 MED ORDER — OXYCODONE HCL 5 MG PO TABS: 5.0000 mg | ORAL_TABLET | Freq: Once | ORAL | Status: DC | PRN

## 2014-03-13 MED ORDER — PROMETHAZINE HCL 25 MG/ML IJ SOLN: 6.2500 mg | INTRAMUSCULAR | Status: DC | PRN

## 2014-03-13 MED ORDER — CEFAZOLIN SODIUM-DEXTROSE 2-3 GM-% IV SOLR
2.0000 g | INTRAVENOUS | Status: AC
  Administered 2014-03-13: 2 g via INTRAVENOUS

## 2014-03-13 MED ORDER — LIDOCAINE HCL (CARDIAC) 20 MG/ML IV SOLN
INTRAVENOUS | Status: DC | PRN
  Administered 2014-03-13: 80 mg via INTRAVENOUS

## 2014-03-13 MED ORDER — PHENYLEPHRINE 40 MCG/ML (10ML) SYRINGE FOR IV PUSH (FOR BLOOD PRESSURE SUPPORT)
PREFILLED_SYRINGE | INTRAVENOUS | Status: AC
  Filled 2014-03-13: qty 10

## 2014-03-13 MED ORDER — CEFAZOLIN SODIUM-DEXTROSE 2-3 GM-% IV SOLR
2.0000 g | Freq: Three times a day (TID) | INTRAVENOUS | Status: AC
  Administered 2014-03-13 – 2014-03-14 (×3): 2 g via INTRAVENOUS
  Filled 2014-03-13 (×3): qty 50

## 2014-03-13 MED ORDER — DOCUSATE SODIUM 100 MG PO CAPS: 100.0000 mg | ORAL_CAPSULE | Freq: Two times a day (BID) | ORAL | Status: AC | PRN

## 2014-03-13 MED ORDER — ACETAMINOPHEN 650 MG RE SUPP: 650.0000 mg | Freq: Four times a day (QID) | RECTAL | Status: DC | PRN

## 2014-03-13 MED ORDER — FLUTICASONE PROPIONATE 50 MCG/ACT NA SUSP
1.0000 | Freq: Every day | NASAL | Status: DC | PRN
  Filled 2014-03-13: qty 16

## 2014-03-13 MED ORDER — GLYCOPYRROLATE 0.2 MG/ML IJ SOLN
INTRAMUSCULAR | Status: DC | PRN
  Administered 2014-03-13: 0.6 mg via INTRAVENOUS

## 2014-03-13 MED ORDER — MENTHOL 3 MG MT LOZG
1.0000 | LOZENGE | OROMUCOSAL | Status: DC | PRN
  Filled 2014-03-13 (×2): qty 9

## 2014-03-13 MED ORDER — SCOPOLAMINE 1 MG/3DAYS TD PT72
MEDICATED_PATCH | TRANSDERMAL | Status: AC
  Filled 2014-03-13: qty 1

## 2014-03-13 MED ORDER — PHENYLEPHRINE HCL 10 MG/ML IJ SOLN
INTRAMUSCULAR | Status: DC | PRN
  Administered 2014-03-13: 160 ug via INTRAVENOUS
  Administered 2014-03-13 (×2): 80 ug via INTRAVENOUS

## 2014-03-13 MED ORDER — HYDROCODONE-ACETAMINOPHEN 5-325 MG PO TABS
1.0000 | ORAL_TABLET | ORAL | Status: DC | PRN
  Administered 2014-03-13 – 2014-03-14 (×3): 1 via ORAL
  Filled 2014-03-13 (×3): qty 1

## 2014-03-13 MED ORDER — SUCCINYLCHOLINE CHLORIDE 20 MG/ML IJ SOLN
INTRAMUSCULAR | Status: DC | PRN
  Administered 2014-03-13: 100 mg via INTRAVENOUS

## 2014-03-13 MED ORDER — EPHEDRINE SULFATE 50 MG/ML IJ SOLN
INTRAMUSCULAR | Status: DC | PRN
  Administered 2014-03-13: 10 mg via INTRAVENOUS

## 2014-03-13 MED ORDER — FENTANYL CITRATE 0.05 MG/ML IJ SOLN
INTRAMUSCULAR | Status: DC | PRN
  Administered 2014-03-13: 50 ug via INTRAVENOUS

## 2014-03-13 MED ORDER — SPIRONOLACTONE 25 MG PO TABS
25.0000 mg | ORAL_TABLET | Freq: Every morning | ORAL | Status: DC
  Administered 2014-03-14: 25 mg via ORAL
  Filled 2014-03-13 (×2): qty 1

## 2014-03-13 MED ORDER — METOCLOPRAMIDE HCL 5 MG/ML IJ SOLN
INTRAMUSCULAR | Status: AC
Start: 2014-03-13 — End: 2014-03-13
  Filled 2014-03-13: qty 2

## 2014-03-13 MED ORDER — ONDANSETRON HCL 4 MG PO TABS: 4.0000 mg | ORAL_TABLET | Freq: Four times a day (QID) | ORAL | Status: DC | PRN

## 2014-03-13 MED ORDER — PROPOFOL 10 MG/ML IV BOLUS
INTRAVENOUS | Status: DC | PRN
  Administered 2014-03-13: 140 mg via INTRAVENOUS

## 2014-03-13 MED ORDER — SODIUM CHLORIDE 0.9 % IR SOLN
Status: DC | PRN
  Administered 2014-03-13: 11:00:00

## 2014-03-13 MED ORDER — SCOPOLAMINE 1 MG/3DAYS TD PT72
MEDICATED_PATCH | TRANSDERMAL | Status: DC | PRN
  Administered 2014-03-13: 1 via TRANSDERMAL

## 2014-03-13 MED ORDER — INDAPAMIDE 1.25 MG PO TABS
1.2500 mg | ORAL_TABLET | Freq: Every day | ORAL | Status: DC
  Administered 2014-03-14: 1.25 mg via ORAL
  Filled 2014-03-13 (×3): qty 1

## 2014-03-13 MED ORDER — AMLODIPINE BESYLATE 10 MG PO TABS
10.0000 mg | ORAL_TABLET | Freq: Every morning | ORAL | Status: DC
  Administered 2014-03-14: 10 mg via ORAL
  Filled 2014-03-13 (×2): qty 1

## 2014-03-13 MED ORDER — PROPOFOL 10 MG/ML IV BOLUS
INTRAVENOUS | Status: AC
  Filled 2014-03-13: qty 20

## 2014-03-13 MED ORDER — FENTANYL CITRATE 0.05 MG/ML IJ SOLN
INTRAMUSCULAR | Status: AC
  Filled 2014-03-13: qty 2

## 2014-03-13 MED ORDER — NEOSTIGMINE METHYLSULFATE 10 MG/10ML IV SOLN
INTRAVENOUS | Status: DC | PRN
  Administered 2014-03-13: 5 mg via INTRAVENOUS

## 2014-03-13 MED ORDER — HYDROMORPHONE HCL PF 1 MG/ML IJ SOLN: 0.2500 mg | INTRAMUSCULAR | Status: DC | PRN

## 2014-03-13 MED ORDER — PRAMIPEXOLE DIHYDROCHLORIDE 0.25 MG PO TABS
0.5000 mg | ORAL_TABLET | Freq: Every day | ORAL | Status: DC
  Administered 2014-03-13: 0.5 mg via ORAL
  Filled 2014-03-13 (×2): qty 2

## 2014-03-13 MED ORDER — IRBESARTAN 300 MG PO TABS
300.0000 mg | ORAL_TABLET | Freq: Every morning | ORAL | Status: DC
  Administered 2014-03-14: 300 mg via ORAL
  Filled 2014-03-13 (×2): qty 1

## 2014-03-13 MED ORDER — DOCUSATE SODIUM 100 MG PO CAPS
100.0000 mg | ORAL_CAPSULE | Freq: Two times a day (BID) | ORAL | Status: DC
  Administered 2014-03-13 – 2014-03-14 (×3): 100 mg via ORAL

## 2014-03-13 MED ORDER — DEXAMETHASONE SODIUM PHOSPHATE 10 MG/ML IJ SOLN
INTRAMUSCULAR | Status: DC | PRN
  Administered 2014-03-13: 10 mg via INTRAVENOUS

## 2014-03-13 MED ORDER — BUPIVACAINE-EPINEPHRINE (PF) 0.5% -1:200000 IJ SOLN
INTRAMUSCULAR | Status: AC
  Filled 2014-03-13: qty 30

## 2014-03-13 MED ORDER — BUPIVACAINE-EPINEPHRINE 0.5% -1:200000 IJ SOLN
INTRAMUSCULAR | Status: DC | PRN
  Administered 2014-03-13: 22 mL

## 2014-03-13 MED ORDER — METOCLOPRAMIDE HCL 10 MG PO TABS: 5.0000 mg | ORAL_TABLET | Freq: Three times a day (TID) | ORAL | Status: DC | PRN

## 2014-03-13 MED ORDER — CEFAZOLIN SODIUM-DEXTROSE 2-3 GM-% IV SOLR
INTRAVENOUS | Status: AC
  Filled 2014-03-13: qty 50

## 2014-03-13 SURGICAL SUPPLY — 30 items
ANCHOR PEEK 4.75X19.1 SWLK C (Anchor) ×2 IMPLANT
CLEANER TIP ELECTROSURG 2X2 (MISCELLANEOUS) ×2 IMPLANT
CLOTH 2% CHLOROHEXIDINE 3PK (PERSONAL CARE ITEMS) ×2 IMPLANT
DRAPE ORTHO SPLIT 77X108 STRL (DRAPES) ×1
DRAPE POUCH INSTRU U-SHP 10X18 (DRAPES) ×2 IMPLANT
DRAPE SURG ORHT 6 SPLT 77X108 (DRAPES) ×1 IMPLANT
DRSG AQUACEL AG ADV 3.5X 4 (GAUZE/BANDAGES/DRESSINGS) ×2 IMPLANT
DURAPREP 26ML APPLICATOR (WOUND CARE) ×2 IMPLANT
ELECT NEEDLE TIP 2.8 STRL (NEEDLE) ×2 IMPLANT
ELECT REM PT RETURN 9FT ADLT (ELECTROSURGICAL) ×2
ELECTRODE REM PT RTRN 9FT ADLT (ELECTROSURGICAL) ×1 IMPLANT
GLOVE BIOGEL PI IND STRL 7.5 (GLOVE) ×1 IMPLANT
GLOVE BIOGEL PI INDICATOR 7.5 (GLOVE) ×1
GLOVE SURG SS PI 7.5 STRL IVOR (GLOVE) ×2 IMPLANT
GLOVE SURG SS PI 8.0 STRL IVOR (GLOVE) ×4 IMPLANT
GOWN STRL REUS W/TWL XL LVL3 (GOWN DISPOSABLE) ×4 IMPLANT
KIT BASIN OR (CUSTOM PROCEDURE TRAY) ×2 IMPLANT
KIT POSITION SHOULDER SCHLEI (MISCELLANEOUS) ×2 IMPLANT
MANIFOLD NEPTUNE II (INSTRUMENTS) ×2 IMPLANT
NEEDLE SCORPION MULTI FIRE (NEEDLE) ×2 IMPLANT
PACK SHOULDER CUSTOM OPM052 (CUSTOM PROCEDURE TRAY) ×2 IMPLANT
POSITIONER SURGICAL ARM (MISCELLANEOUS) ×2 IMPLANT
SLING ARM IMMOBILIZER MED (SOFTGOODS) ×2 IMPLANT
STRIP CLOSURE SKIN 1/2X4 (GAUZE/BANDAGES/DRESSINGS) ×2 IMPLANT
SUT FIBERWIRE #2 38 T-5 BLUE (SUTURE) ×2
SUT PROLENE 3 0 PS 2 (SUTURE) ×2 IMPLANT
SUT VIC AB 1-0 CT2 27 (SUTURE) ×2 IMPLANT
SUT VIC AB 2-0 CT2 27 (SUTURE) ×2 IMPLANT
SUT VICRYL 0-0 OS 2 NEEDLE (SUTURE) ×2 IMPLANT
SUTURE FIBERWR #2 38 T-5 BLUE (SUTURE) ×1 IMPLANT

## 2014-03-13 NOTE — H&P (View-Only) (Signed)
Monique Hudson is an 79 y.o. female.   Chief Complaint: left shoulder pain HPI: The patient is a 79 year old female who presents today for follow up of their shoulder. The patient is being followed for their left shoulder pain. They are 8.5 month(s) out from injury. Symptoms reported today include: pain. The patient feels that they are doing poorly and report their pain level to be 9 / 10. The following medication has been used for pain control: Hydrocodone. The patient has reported improvement of their symptoms with: Cortisone injections (helped for 3-4 weeks). She is 8.5 months s/p injury (August 2014) from lifting. She last saw Dr. Tonita Cong in December, to review her MRI with partial RCT, she was given a celestone injection which she states only worked for a few weeks (less than 4). The steroid injection in August also only gave her temporary relief. She has put off her follow up while getting some dental work done but her pain has continued to progress since her last appt and is now severe and intolerable. She is taking Norco prn, sleeping in a recliner. She reports pain even at rest, worse with motion of the shoulder, which is decreased, and it is also weak. Sometimes pain radiates down the entire arm when severe, but she denies any numbness or tingling. She is requesting new Rx for Norco.  Past Medical History  Diagnosis Date  . Hypertension   . Restless leg syndrome   . Hypothyroidism   . Arthritis     PAIN AND ARTHRITITS RIGHT KNEE  . Shortness of breath     WITH EXERTION  . Bronchitis     HX OF CHRONIC BRONCHITIS--NO RECENT PROBLEMS  . Sleep apnea     NOT USING CPAP - IS USING OXYGEN 2 L PER NASAL CANNULA WHEN SLEEPING  . Claustrophobia   . Complication of anesthesia     PT REPORTS PROBLEMS WITH HER OXYGEN SATURATION AFTER ONE OF HER PAST SURGERIES.  PT STATES SPINAL FOR MOST RECENT LEFT TKA ON 08/12/11 AT [facility] AND NO PROBLEMS.  . Heart murmur     DOESN'T CAUSE ANY PROBLEMS AND THE  DOCTORS DO NOT ALWAYS HEAR THE MURMUR  . Asthma     NO RECENT ASTHMA FLARE UPS  . Multiple environmental allergies   . Pain     LEFT SHOULDER - TORN ROTATOR CUFF    Past Surgical History  Procedure Laterality Date  . Eye surgery  2011    BILATERAL CATARACT EXTRACTION  . Abdominal hysterectomy  2000  . Lipomas removed from arms and abdomen    . Breast surgery  1982     CYST REMOVED LEFT BREAST  . Resection of ovaries  1964    FOR OVULATION PURPOSE  . Appendectomy    . Cardiac catheterization      APPROXIMATELY 6 YRS AGO--TOLD MINIMAL BLOCKAGE-DONE AT [facility]  . Total knee arthroplasty  02/16/2012    Procedure: TOTAL KNEE ARTHROPLASTY;  Surgeon: Johnn Hai, MD;  Location: [facility];  Service: Orthopedics;  Laterality: Right;  . Orif humerus fracture Right 01/04/2013    Procedure: OPEN REDUCTION INTERNAL FIXATION (ORIF) PROXIMAL HUMERUS FRACTURE;  Surgeon: Augustin Schooling, MD;  Location: [place];  Service: Orthopedics;  Laterality: Right;  . Joint replacement  08/12/2011    LEFT TOTAL KNEE ARTHROPLASTY    No family history on file. Social History:  reports that she has quit smoking. She does not have any smokeless tobacco history on file.  She reports that she drinks alcohol. She reports that she does not use illicit drugs.  Allergies: No Known Allergies   (Not in a hospital admission)  Results for orders placed during the hospital encounter of 03/04/14 (from the past 48 hour(s))  BASIC METABOLIC PANEL     Status: Abnormal   Collection Time    03/04/14  1:15 PM      Result Value Ref Range   Sodium 139  137 - 147 mEq/L   Potassium 4.7  3.7 - 5.3 mEq/L   Chloride 100  96 - 112 mEq/L   CO2 27  19 - 32 mEq/L   Glucose, Bld 135 (*) 70 - 99 mg/dL   BUN 31 (*) 6 - 23 mg/dL   Creatinine, Ser 1.36 (*) 0.50 - 1.10 mg/dL   Calcium 10.1  8.4 - 10.5 mg/dL   GFR calc non Af Amer 37 (*) >90 mL/min   GFR calc Af Amer 43 (*) >90 mL/min   Comment: (NOTE)     The eGFR has been  calculated using the CKD EPI equation.     This calculation has not been validated in all clinical situations.     eGFR's persistently <90 mL/min signify possible Chronic Kidney     Disease.  CBC     Status: None   Collection Time    03/04/14  1:15 PM      Result Value Ref Range   WBC 6.9  4.0 - 10.5 K/uL   RBC 4.31  3.87 - 5.11 MIL/uL   Hemoglobin 12.8  12.0 - 15.0 g/dL   HCT 39.5  36.0 - 46.0 %   MCV 91.6  78.0 - 100.0 fL   MCH 29.7  26.0 - 34.0 pg   MCHC 32.4  30.0 - 36.0 g/dL   RDW 14.5  11.5 - 15.5 %   Platelets 242  150 - 400 K/uL   Dg Chest 2 View  03/04/2014   CLINICAL DATA:  Preop for left shoulder surgery.  Hypertension.  EXAM: CHEST  2 VIEW  COMPARISON:  03/11/2012  FINDINGS: Heart, mediastinum and hila are unremarkable.  The lungs are clear.  No pleural effusion or pneumothorax.  Right proximal humeral fracture has been reduced with an intra medullary rod and screws, new from the prior exam.  Bony thorax is demineralized but otherwise unremarkable.  IMPRESSION: No active cardiopulmonary disease.   Electronically Signed   By: Lajean Manes M.D.   On: 03/04/2014 16:26    Review of Systems  Constitutional: Negative.   HENT: Negative.   Eyes: Negative.   Respiratory: Negative.   Cardiovascular: Negative.   Gastrointestinal: Negative.   Genitourinary: Negative.   Musculoskeletal: Positive for joint pain.  Skin: Negative.   Neurological: Negative.   Psychiatric/Behavioral: Negative.     There were no vitals taken for this visit. Physical Exam  Constitutional: She is oriented to person, place, and time. She appears well-developed and well-nourished.  HENT:  Head: Normocephalic and atraumatic.  Eyes: Conjunctivae and EOM are normal. Pupils are equal, round, and reactive to light.  Neck: Normal range of motion. Neck supple.  Respiratory: Effort normal and breath sounds normal.  GI: Soft. Bowel sounds are normal.  Musculoskeletal:  General Mental Status - Alert.  General Appearance- pleasant and In acute distress. Orientation- Oriented X3. Build & Nutrition- Obese.   Musculoskeletal Upper Extremity Left Upper Extremity: Left Shoulder: Inspection and Palpation:Tenderness- subacromial space tender to palpation. no tenderness to palpation of the [facility] joint,  no tenderness to palpation of the [place] joint and no tenderness to palpation of the clavicle. Swelling- none. Tissue tension/texture is - soft. Sensation- intact to light touch. Skin:Color- no ecchymosis and no erythema. ROM: Internal Rotation:AROM- severely decreased and painful. External Rotation:AROM- full. Flexion:AROM- mildly decreased and painful. Glenohumeral Abduction:AROM- mildly decreased and painful. Strength and Tone:Biceps- 5/5. Triceps- 5/5. Abduction- 4+/5 and painful. Internal Rotation- 5/5. External Rotation- 4+/5 and painful. Instability- sulcus sign negative. Impingement- impingement sign positive and secondary impingement sign positive. Deformities/Malalignments/Discrepancies- no deformities noted. Special Testing- Speed's test negative.  Neurological: She is alert and oriented to person, place, and time. She has normal reflexes.  Skin: Skin is warm and dry.  Psychiatric: She has a normal mood and affect.    prior xrays reviewed with type 2 acromion, decreased subacromial space.  MRI L shoulder images and report reviewed with high-grade interstitial tear supraspinatus, bursal surface, 38m x 660m  Assessment/Plan L shoulder RCT  Pt with ongoing L shoulder pain, decreased ROM, weakness, impingement symptoms, 7 months duration following lifting injury refractory to multiple steroid injections, HEP, activity modifications, relative rest, pain medication. MRI with partial thickness RCT, which at this point has likely progressed to >50% given her increased pain. We discussed relevant anatomy in detail. Discussed tx options at this point. Given her limited  relief with injections thus far, continued weakness, stiffness, impingement symptoms, recommend proceeding with surgery, which would be L shoulder mini-open SAD, RCR. We discussed the procedure itself as well as risks, complications, and alternatives including but not limited to DVT, PE, infx, bleeding, failure of procedure, need for secondary procedure, anesthesia risk, even stroke or death. Also discussed typical post-op protocols, need for PT, sling immobilization, activity limitations and modifications to avoid exacerbation. All questions were answered. Patient desires to proceed with surgery. Will obtain pre-op clearance from her PCP Dr. WiOlin Hausershe has an appt with him on 3/10. We also discussed her R shoulder stiffness and reviewed some ROM and strengthening exercises, gave her a theraband. We will proceed accordingly and schedule surgery once clearance is obtained. Refilled Norco. She will follow up 10-14 days post-op for suture removal.  I had a long discussion with the patient concerning the risks and benefits of a rotator cuff repair, including bleeding, infection, prolonged postoperative recovery, which may require 3 to 5 months until maximum medical improvement. Overight procedure with initiation of early passive range of motion within physical therapy. Avoid any active motion for the first six weeks. This is all in an effort to avoid recurrent tear of the rotator cuff and adhesive capsulitis. Return to work without use of the arm can be obtained following two weeks. However, driving will be a challenge. We also discussed the possibility of requiring implants including bone anchors,as well as an Allograft patch graft if a massive rotator cuff tear is encountered. Removal of any bones for spurs as well as bursitis will be performed during the procedure and also any associated anesthetic complications as well.  Plan L shoulder mini-open SAD, RCR  Danetta Prom M. Glenys Snader for Dr. BeTonita Cong/29/2015,  1:16 PM

## 2014-03-13 NOTE — Anesthesia Preprocedure Evaluation (Signed)
Anesthesia Evaluation  Patient identified by MRN, date of birth, ID band Patient awake    Reviewed: Allergy & Precautions, H&P , NPO status , Patient's Chart, lab work & pertinent test results  History of Anesthesia Complications (+) AWARENESS UNDER ANESTHESIANegative for: history of anesthetic complications  Airway Mallampati: II TM Distance: >3 FB Neck ROM: Full    Dental  (+) Edentulous Upper, Edentulous Lower   Pulmonary shortness of breath, asthma , sleep apnea (noncompiant with CPAP) , former smoker,  breath sounds clear to auscultation  Pulmonary exam normal       Cardiovascular hypertension, Pt. on medications + Valvular Problems/Murmurs Rhythm:Regular Rate:Normal     Neuro/Psych Anxiety negative neurological ROS     GI/Hepatic negative GI ROS, Neg liver ROS,   Endo/Other  Hypothyroidism Morbid obesity  Renal/GU negative Renal ROS     Musculoskeletal negative musculoskeletal ROS (+)   Abdominal   Peds  Hematology negative hematology ROS (+)   Anesthesia Other Findings   Reproductive/Obstetrics negative OB ROS                           Anesthesia Physical  Anesthesia Plan  ASA: III  Anesthesia Plan: General   Post-op Pain Management:    Induction: Intravenous  Airway Management Planned: Oral ETT  Additional Equipment:   Intra-op Plan:   Post-operative Plan: Extubation in OR  Informed Consent:   Dental advisory given  Plan Discussed with: CRNA  Anesthesia Plan Comments:         Anesthesia Quick Evaluation

## 2014-03-13 NOTE — Transfer of Care (Signed)
Immediate Anesthesia Transfer of Care Note  Patient: Monique Hudson  Procedure(s) Performed: Procedure(s): LEFT SHOULDER MINI OPEN ROTATOR REPAIR AND SUBACROMIAL DECOMPRESSION (Left)  Patient Location: PACU  Anesthesia Type:General  Level of Consciousness: awake and alert   Airway & Oxygen Therapy: Patient Spontanous Breathing and Patient connected to face mask oxygen  Post-op Assessment: Report given to PACU RN and Post -op Vital signs reviewed and stable  Post vital signs: Reviewed and stable  Complications: No apparent anesthesia complications

## 2014-03-13 NOTE — Discharge Instructions (Signed)
Change dressing daily, keep wound clean and dry. Use sling at times except when exercising or showering Ok to shower in 72 hours No driving for 4-6 weeks No lifting for 6 weeks operative arm Pendulum exercises as instructed. Ok to move wrist,elbow, and hand. See Dr. Shelle IronBeane in 10-14 days. Take one aspirin per day with a meal if not on a blood thinner or allergic to aspirin.Change dressing daily, keep wound clean and dry. Use sling at times except when exercising or showering Ok to shower in 72 hours No driving for 4-6 weeks No lifting for 6 weeks operative arm Pendulum exercises as instructed. Ok to move wrist,elbow, and hand. See Dr. Shelle IronBeane in 10-14 days. Take one aspirin per day with a meal if not on a blood thinner or allergic to aspirin.

## 2014-03-13 NOTE — Anesthesia Postprocedure Evaluation (Signed)
Anesthesia Post Note  Patient: Monique Hudson  Procedure(s) Performed: Procedure(s) (LRB): LEFT SHOULDER MINI OPEN ROTATOR REPAIR AND SUBACROMIAL DECOMPRESSION (Left)  Anesthesia type: General  Patient location: PACU  Post pain: Pain level controlled  Post assessment: Post-op Vital signs reviewed  Last Vitals: BP 107/55  Pulse 69  Temp(Src) 36.5 C (Oral)  Resp 16  Ht 5\' 1"  (1.549 m)  Wt 242 lb (109.77 kg)  BMI 45.75 kg/m2  SpO2 97%  Post vital signs: Reviewed  Level of consciousness: sedated  Complications: No apparent anesthesia complications

## 2014-03-13 NOTE — Brief Op Note (Signed)
03/13/2014  11:31 AM  PATIENT:  Rolan LipaMigdalia M Cupit  79 y.o. female  PRE-OPERATIVE DIAGNOSIS:  LEFT SHOULDER IMPINGEMENT AND ROTATOR CUFF TEAR  POST-OPERATIVE DIAGNOSIS:  LEFT SHOULDER IMPINGEMENT AND ROTATOR CUFF TEAR  PROCEDURE:  Procedure(s): LEFT SHOULDER MINI OPEN ROTATOR REPAIR AND SUBACROMIAL DECOMPRESSION (Left)  SURGEON:  Surgeon(s) and Role:    * Javier DockerJeffrey C Mahamud Metts, MD - Primary  PHYSICIAN ASSISTANT:   ASSISTANTS: Bissell   ANESTHESIA:   general  EBL:     BLOOD ADMINISTERED:none  DRAINS: none   LOCAL MEDICATIONS USED:  MARCAINE     SPECIMEN:  No Specimen  DISPOSITION OF SPECIMEN:  no  COUNTS:  YES  TOURNIQUET:  * No tourniquets in log *  DICTATION: .Other Dictation: Dictation Number [id number]  PLAN OF CARE: Admit for overnight observation  PATIENT DISPOSITION:  PACU - hemodynamically stable.   Delay start of Pharmacological VTE agent (>24hrs) due to surgical blood loss or risk of bleeding: no

## 2014-03-13 NOTE — Interval H&P Note (Signed)
History and Physical Interval Note:  03/13/2014 7:27 AM  Monique Hudson  has presented today for surgery, with the diagnosis of LEFT SHOULDER IMPINGEMENT AND ROTATOR CUFF TEAR  The various methods of treatment have been discussed with the patient and family. After consideration of risks, benefits and other options for treatment, the patient has consented to  Procedure(s): LEFT SHOULDER MINI OPEN ROTATOR REPAIR AND SUBACROMIAL DECOMPRESSION (Left) as a surgical intervention .  The patient's history has been reviewed, patient examined, no change in status, stable for surgery.  I have reviewed the patient's chart and labs.  Questions were answered to the patient's satisfaction.     Javier DockerJeffrey C Mercedes Fort

## 2014-03-14 ENCOUNTER — Encounter (HOSPITAL_COMMUNITY): Payer: Self-pay | Admitting: Specialist

## 2014-03-14 LAB — BASIC METABOLIC PANEL
BUN: 30 mg/dL — ABNORMAL HIGH (ref 6–23)
CALCIUM: 9.5 mg/dL (ref 8.4–10.5)
CO2: 25 meq/L (ref 19–32)
CREATININE: 1.28 mg/dL — AB (ref 0.50–1.10)
Chloride: 98 mEq/L (ref 96–112)
GFR calc Af Amer: 46 mL/min — ABNORMAL LOW (ref >90)
GFR calc non Af Amer: 40 mL/min — ABNORMAL LOW (ref >90)
Glucose, Bld: 147 mg/dL — ABNORMAL HIGH (ref 70–99)
Potassium: 4.7 mEq/L (ref 3.7–5.3)
Sodium: 136 mEq/L — ABNORMAL LOW (ref 137–147)

## 2014-03-14 NOTE — Discharge Summary (Signed)
Physician Discharge Summary   Patient ID: Monique Hudson MRN: [id number] DOB/AGE: 77-01-1935 77 y.o.  Admit date: 03/13/2014 Discharge date: 03/14/2014  Primary Diagnosis:   LEFT SHOULDER IMPINGEMENT AND ROTATOR CUFF TEAR  Admission Diagnoses:  Past Medical History  Diagnosis Date  . Hypertension   . Restless leg syndrome   . Hypothyroidism   . Arthritis     PAIN AND ARTHRITITS RIGHT KNEE  . Shortness of breath     WITH EXERTION  . Bronchitis     HX OF CHRONIC BRONCHITIS--NO RECENT PROBLEMS  . Sleep apnea     NOT USING CPAP - IS USING OXYGEN 2 L PER NASAL CANNULA WHEN SLEEPING  . Claustrophobia   . Complication of anesthesia     PT REPORTS PROBLEMS WITH HER OXYGEN SATURATION AFTER ONE OF HER PAST SURGERIES.  PT STATES SPINAL FOR MOST RECENT LEFT TKA ON 08/12/11 AT [facility] AND NO PROBLEMS.  . Heart murmur     DOESN'T CAUSE ANY PROBLEMS AND THE DOCTORS DO NOT ALWAYS HEAR THE MURMUR  . Asthma     NO RECENT ASTHMA FLARE UPS  . Multiple environmental allergies   . Pain     LEFT SHOULDER - TORN ROTATOR CUFF  . PONV (postoperative nausea and vomiting)    Discharge Diagnoses:   Active Problems:   Rotator cuff tear, left  Procedure:  Procedure(s) (LRB): LEFT SHOULDER MINI OPEN ROTATOR REPAIR AND SUBACROMIAL DECOMPRESSION (Left)   Consults: None  HPI:  see H&P    Laboratory Data: Hospital Outpatient Visit on 03/04/2014  Component Date Value Ref Range Status  . Sodium 03/04/2014 139  137 - 147 mEq/L Final  . Potassium 03/04/2014 4.7  3.7 - 5.3 mEq/L Final  . Chloride 03/04/2014 100  96 - 112 mEq/L Final  . CO2 03/04/2014 27  19 - 32 mEq/L Final  . Glucose, Bld 03/04/2014 135* 70 - 99 mg/dL Final  . BUN 03/04/2014 31* 6 - 23 mg/dL Final  . Creatinine, Ser 03/04/2014 1.36* 0.50 - 1.10 mg/dL Final  . Calcium 03/04/2014 10.1  8.4 - 10.5 mg/dL Final  . GFR calc non Af Amer 03/04/2014 37* >90 mL/min Final  . GFR calc Af Amer 03/04/2014 43* >90 mL/min Final   Comment:  (NOTE)                          The eGFR has been calculated using the CKD EPI equation.                          This calculation has not been validated in all clinical situations.                          eGFR's persistently <90 mL/min signify possible Chronic Kidney                          Disease.  . WBC 03/04/2014 6.9  4.0 - 10.5 K/uL Final  . RBC 03/04/2014 4.31  3.87 - 5.11 MIL/uL Final  . Hemoglobin 03/04/2014 12.8  12.0 - 15.0 g/dL Final  . HCT 03/04/2014 39.5  36.0 - 46.0 % Final  . MCV 03/04/2014 91.6  78.0 - 100.0 fL Final  . MCH 03/04/2014 29.7  26.0 - 34.0 pg Final  . MCHC 03/04/2014 32.4  30.0 - 36.0 g/dL Final  . RDW 03/04/2014 14.5  11.5 - 15.5 % Final  . Platelets 03/04/2014 242  150 - 400 K/uL Final    Recent Labs  03/13/14 1336  HGB 12.5    Recent Labs  03/13/14 1336  WBC 8.9  RBC 4.18  HCT 38.8  PLT 161    Recent Labs  03/13/14 1336 03/14/14 0430  NA  --  136*  K  --  4.7  CL  --  98  CO2  --  25  BUN  --  30*  CREATININE 1.22* 1.28*  GLUCOSE  --  147*  CALCIUM  --  9.5   No results found for this basename: LABPT, INR,  in the last 72 hours  X-Rays:Dg Chest 2 View  03/04/2014   CLINICAL DATA:  Preop for left shoulder surgery.  Hypertension.  EXAM: CHEST  2 VIEW  COMPARISON:  03/11/2012  FINDINGS: Heart, mediastinum and hila are unremarkable.  The lungs are clear.  No pleural effusion or pneumothorax.  Right proximal humeral fracture has been reduced with an intra medullary rod and screws, new from the prior exam.  Bony thorax is demineralized but otherwise unremarkable.  IMPRESSION: No active cardiopulmonary disease.   Electronically Signed   By: Lajean Manes M.D.   On: 03/04/2014 16:26    EKG: Orders placed during the hospital encounter of 03/04/14  . EKG 12-LEAD  . EKG 12-LEAD     Hospital Course: Patient was admitted to [facility] and taken to the OR and underwent the above state procedure without complications.  Patient  tolerated the procedure well and was later transferred to the recovery room and then to the orthopaedic floor for postoperative care.  They were given PO and IV analgesics for pain control following their surgery.  They were given 24 hours of postoperative antibiotics.   PT was consulted postop to assist with pendulums. Discharge planning was consulted to help with postop disposition and equipment needs.  Patient had a good night on the evening of surgery and started to get up OOB with therapy on day one. Patient was seen in rounds and was ready to go home on day one.  They were given discharge instructions and dressing directions.  They were instructed on when to follow up in the office with Dr. Tonita Cong.  Discharge Medications: Prior to Admission medications   Medication Sig Start Date End Date Taking? Authorizing Provider  ALBUTEROL IN Inhale into the lungs. AS NEEDED FOR WHEEZING / ASTHMA FLARE UPS   Yes Historical Provider, MD  amLODipine (NORVASC) 10 MG tablet Take 10 mg by mouth every morning.   Yes Historical Provider, MD  aspirin 325 MG tablet Take 325 mg by mouth every morning.   Yes Historical Provider, MD  atorvastatin (LIPITOR) 10 MG tablet Take 10 mg by mouth every evening.   Yes Historical Provider, MD  Cholecalciferol (VITAMIN D-3) 5000 UNITS TABS Take 5,000 Units by mouth every evening.    Yes Historical Provider, MD  fluticasone (FLONASE) 50 MCG/ACT nasal spray Place into both nostrils daily as needed for allergies or rhinitis.   Yes Historical Provider, MD  HYDROcodone-acetaminophen (NORCO/VICODIN) 5-325 MG per tablet Take 1 tablet by mouth every 6 (six) hours as needed for moderate pain.   Yes Historical Provider, MD  ibuprofen (ADVIL,MOTRIN) 200 MG tablet Take 400 mg by mouth every 6 (six) hours as needed for headache.   Yes Historical Provider, MD  indapamide (LOZOL) 1.25 MG tablet Take 1.25 mg by mouth every morning.   Yes  Historical Provider, MD  irbesartan (AVAPRO) 300 MG tablet  Take 300 mg by mouth every morning.   Yes Historical Provider, MD  levothyroxine (SYNTHROID, LEVOTHROID) 137 MCG tablet Take 137 mcg by mouth daily before breakfast.   Yes Historical Provider, MD  MAGNESIUM-ZINC PO Take 1 tablet by mouth daily.   Yes Historical Provider, MD  omega-3 acid ethyl esters (LOVAZA) 1 G capsule Take 1 g by mouth daily.    Yes Historical Provider, MD  pramipexole (MIRAPEX) 0.5 MG tablet Take 0.5-1 mg by mouth 2 (two) times daily. Takes 2 tablets  every morning and 1 tablet every evening   Yes Historical Provider, MD  spironolactone (ALDACTONE) 25 MG tablet Take 25 mg by mouth every morning.   Yes Historical Provider, MD  cyanocobalamin (,VITAMIN B-12,) 1000 MCG/ML injection Inject 1,000 mcg into the muscle every 30 (thirty) days.    Historical Provider, MD  docusate sodium (COLACE) 100 MG capsule Take 1 capsule (100 mg total) by mouth 2 (two) times daily as needed for mild constipation. 03/13/14   Johnn Hai, MD  HYDROcodone-acetaminophen (NORCO) 7.5-325 MG per tablet Take 1-2 tablets by mouth every 4 (four) hours as needed for moderate pain. 03/13/14   Johnn Hai, MD    Diet: Regular diet Activity:NWB Follow-up:in 10-14 days Disposition - Home Discharged Condition: good   Discharge Orders   Future Orders Complete By Expires   Call MD / Call 911  As directed    Constipation Prevention  As directed    Diet - low sodium heart healthy  As directed    Increase activity slowly as tolerated  As directed        Medication List    STOP taking these medications       HYDROcodone-acetaminophen 5-325 MG per tablet  Commonly known as:  NORCO/VICODIN  Replaced by:  HYDROcodone-acetaminophen 7.5-325 MG per tablet      TAKE these medications       ALBUTEROL IN  Inhale into the lungs. AS NEEDED FOR WHEEZING / ASTHMA FLARE UPS     amLODipine 10 MG tablet  Commonly known as:  NORVASC  Take 10 mg by mouth every morning.     aspirin 325 MG tablet  Take 325 mg  by mouth every morning.     atorvastatin 10 MG tablet  Commonly known as:  LIPITOR  Take 10 mg by mouth every evening.     cyanocobalamin 1000 MCG/ML injection  Commonly known as:  (VITAMIN B-12)  Inject 1,000 mcg into the muscle every 30 (thirty) days.     docusate sodium 100 MG capsule  Commonly known as:  COLACE  Take 1 capsule (100 mg total) by mouth 2 (two) times daily as needed for mild constipation.     fluticasone 50 MCG/ACT nasal spray  Commonly known as:  FLONASE  Place into both nostrils daily as needed for allergies or rhinitis.     HYDROcodone-acetaminophen 7.5-325 MG per tablet  Commonly known as:  NORCO  Take 1-2 tablets by mouth every 4 (four) hours as needed for moderate pain.     ibuprofen 200 MG tablet  Commonly known as:  ADVIL,MOTRIN  Take 400 mg by mouth every 6 (six) hours as needed for headache.     indapamide 1.25 MG tablet  Commonly known as:  LOZOL  Take 1.25 mg by mouth every morning.     irbesartan 300 MG tablet  Commonly known as:  AVAPRO  Take 300 mg by mouth  every morning.     levothyroxine 137 MCG tablet  Commonly known as:  SYNTHROID, LEVOTHROID  Take 137 mcg by mouth daily before breakfast.     MAGNESIUM-ZINC PO  Take 1 tablet by mouth daily.     omega-3 acid ethyl esters 1 G capsule  Commonly known as:  LOVAZA  Take 1 g by mouth daily.     pramipexole 0.5 MG tablet  Commonly known as:  MIRAPEX  Take 0.5-1 mg by mouth 2 (two) times daily. Takes 2 tablets  every morning and 1 tablet every evening     spironolactone 25 MG tablet  Commonly known as:  ALDACTONE  Take 25 mg by mouth every morning.     Vitamin D-3 5000 UNITS Tabs  Take 5,000 Units by mouth every evening.           Follow-up Information   Follow up with BEANE,JEFFREY C, MD In 14 days.   Specialty:  Orthopedic Surgery   Contact information:   [address] [place] [address] [phone]       Signed: Lacie Draft,  PA-C Orthopaedic Surgery 03/14/2014, 8:32 AM

## 2014-03-14 NOTE — Progress Notes (Signed)
Subjective: 1 Day Post-Op Procedure(s) (LRB): LEFT SHOULDER MINI OPEN ROTATOR REPAIR AND SUBACROMIAL DECOMPRESSION (Left) Patient reports pain as mild.  Pain well controlled. No numbness or tingling. Doing well, feels ready to go home.  Objective: Vital signs in last 24 hours: Temp:  [97.4 F (36.3 C)-98.8 F (37.1 C)] 98.6 F (37 C) (05/08 0703) Pulse Rate:  [67-79] 73 (05/08 0703) Resp:  [16-20] 16 (05/08 0703) BP: (90-150)/(17-73) 112/59 mmHg (05/08 0732) SpO2:  [90 %-97 %] 90 % (05/08 0703) Weight:  [109.77 kg (242 lb)] 109.77 kg (242 lb) (05/07 1243)  Intake/Output from previous day: 05/07 0701 - 05/08 0700 In: 1667.5 [P.O.:480; I.V.:1037.5; IV Piggyback:150] Out: 900 [Urine:900] Intake/Output this shift: Total I/O In: 240 [P.O.:240] Out: 300 [Urine:300]   Recent Labs  03/13/14 1336  HGB 12.5    Recent Labs  03/13/14 1336  WBC 8.9  RBC 4.18  HCT 38.8  PLT 161    Recent Labs  03/13/14 1336 03/14/14 0430  NA  --  136*  K  --  4.7  CL  --  98  CO2  --  25  BUN  --  30*  CREATININE 1.22* 1.28*  GLUCOSE  --  147*  CALCIUM  --  9.5   No results found for this basename: LABPT, INR,  in the last 72 hours  Neurologically intact ABD soft Neurovascular intact Sensation intact distally Intact pulses distally Dorsiflexion/Plantar flexion intact Incision: dressing C/D/I and no drainage No cellulitis present Compartment soft no calf pain or sign of DVT  Assessment/Plan: 1 Day Post-Op Procedure(s) (LRB): LEFT SHOULDER MINI OPEN ROTATOR REPAIR AND SUBACROMIAL DECOMPRESSION (Left) Advance diet Up with therapy D/C IV fluids D/C home today Reviewed PT, pendulums, activity modifications, use of sling, dressing instructions D/C home today Will discuss with Dr. Jamie KatoBeane  Jaclyn M. Bissell 03/14/2014, 8:30 AM

## 2014-03-14 NOTE — Op Note (Signed)
NAMGolden Hurter:  Hudson, Monique             ACCOUNT NO.:  [id number]  MEDICAL RECORD NO.:  [id number]  LOCATION:  1604                         [facility]  PHYSICIAN:  Jene EveryJeffrey Skylier Kretschmer, M.D.    DATE OF BIRTH:  22-Oct-1935  DATE OF PROCEDURE:  03/13/2014 DATE OF DISCHARGE:                              OPERATIVE REPORT   PREOPERATIVE DIAGNOSIS:  Rotator cuff tear, impingement syndrome, left shoulder.  POSTOPERATIVE DIAGNOSIS:  Rotator cuff tear, impingement syndrome, left shoulder.  PROCEDURE PERFORMED: 1. Mini open rotator cuff repair, left shoulder. 2. Subacromial decompression with acromioplasty. 3. Exam under anesthesia.  HISTORY:  This is a 79 year old with tear rotator cuff, refractory pain, and disabling, indicated for repair.  Risk and benefits discussed including bleeding infection suboptimal range of motion, nonhealing, need for revision, et Karie Sodacetera.  TECHNIQUE:  With the patient in supine beach chair position, after induction of adequate general anesthesia, 2 g Kefzol, left shoulder and upper extremity was prepped and draped in usual sterile fashion.  A small incision 2 cm was made over the anterolateral aspect of the acromion.  Self-retaining retractor was placed.  She had abundant subcutaneous adipose tissue.  Raphe between the anterior and lateral heads of the deltoid was identified and divided in line with the skin incision, preserving the deltoid attachment.  Self-retaining retractor was then placed.  Marcaine with epinephrine was placed into the deltoid. I excised the CA ligament.  We removed the spur off the anterolateral aspect of the acromion with a 3-mm Kerrison.  I digitally lysed adhesions in the subacromial space.  Exuberant bursa was noted and this was excised.  We found essentially near full-thickness tear in the supraspinatus at its insertion approximately 1 cm in length.  I incised it and debrided tendon with good bleeding tissue and performed a trough in  the greater tuberosity.  Using an awl, placed a SwiveLock anchor into the bone with excellent resistance to pull out.  I used suture delivered by a Scorpion suture passer to perform two side-to-side surgical knots with excellent repair and approximation of the tendon.  The remainder of the tendon was unremarkable with good range.  Copiously irrigated the wound.  We closed the raphe with 1 Vicryl sutures.  Subcu with 2 and skin with Prolene.  Sterile dressing was applied after irrigation throughout the closure.  The patient was placed in a sling, extubated without difficulty, and transported to the recovery room in satisfactory condition.  The patient tolerated the procedure well.  No complications.  Assistant Lanna PocheJacqueline Bissell, [place] was used for maneuvering of the arm traction in the upper extremity, suction, and help with closure.     Jene EveryJeffrey Patrich Heinze, M.D.     Cordelia PenJB/MEDQ  D:  03/13/2014  T:  03/14/2014  Job:  [id number]

## 2014-03-14 NOTE — Evaluation (Signed)
Occupational Therapy Evaluation Patient Details Name: Monique Hudson MRN: [id number] DOB: 20-Jul-1935 Today's Date: 03/14/2014    History of Present Illness Pt was admitted for L mini open RCR with SAD   Clinical Impression   Pt was admitted for the above surgery.  She has a h/o R shoulder surgery for complex fx.  Pt and husband verbalize understanding of all education.  Pt does need cues not to recruit shoulder muscles during adls.  She holds LUE stiffly during pendulum exercises but does not recruit the muscles actively. She will follow up with Dr Shelle IronBeane for further rehab.    Follow Up Recommendations       Equipment Recommendations  None recommended by OT    Recommendations for Other Services       Precautions / Restrictions Precautions Precautions: Shoulder Type of Shoulder Precautions: sling at all times x bathing,dressing,pendulums per Randolphjackie, PA Precaution Booklet Issued: Yes (comment) Restrictions Weight Bearing Restrictions: No      Mobility Bed Mobility Overal bed mobility: Modified Independent             General bed mobility comments: HOB raised and used rail; pt plans to sleep in recliner  Transfers Overall transfer level: Independent                    Balance                                            ADL Overall ADL's : Needs assistance/impaired         Upper Body Bathing: Moderate assistance;Sitting       Upper Body Dressing : Maximal assistance;Sitting   Lower Body Dressing: Maximal assistance;Sit to/from stand   Toilet Transfer: Min guard;Stand-pivot   Toileting- ArchitectClothing Manipulation and Hygiene: Moderate assistance;Sit to/from stand       Functional mobility during ADLs: Min guard General ADL Comments: pt completed the above adl activities from eob with standing.  All shoulder education completed.  See education portion of chart.  Husband assisted with sling and additional piece was added to belt  strap.     Vision                     Perception     Praxis      Pertinent Vitals/Pain L shoulder sore; repositioned.  Pt getting ready for discharge home     Hand Dominance Right (has used L a lot since R shoulder sx)   Extremity/Trunk Assessment Upper Extremity Assessment Upper Extremity Assessment: RUE deficits/detail;LUE deficits/detail RUE Deficits / Details: previoius sx done.  Pt can lift to 90 but recruits traps LUE: Unable to fully assess due to immobilization (elbow to fingers wfls)           Communication Communication Communication: No difficulties   Cognition Arousal/Alertness: Awake/alert Behavior During Therapy: WFL for tasks assessed/performed Overall Cognitive Status: Within Functional Limits for tasks assessed                     General Comments       Exercises       Shoulder Instructions      Home Living Family/patient expects to be discharged to:: Private residence Living Arrangements: Spouse/significant other  Additional Comments: pt has a walk in shower with a seat.  She can access regular commode without using arms      Prior Functioning/Environment Level of Independence: Independent             OT Diagnosis:     OT Problem List:     OT Treatment/Interventions:      OT Goals(Current goals can be found in the care plan section)    OT Frequency:     Barriers to D/C:            Co-evaluation              End of Session    Activity Tolerance: Patient tolerated treatment well Patient left: in chair;with family/visitor present;with call bell/phone within reach   Time: [id number] OT Time Calculation (min): 42 min Charges:  OT General Charges $OT Visit: 1 Procedure OT Evaluation $Initial OT Evaluation Tier I: 1 Procedure OT Treatments $Self Care/Home Management : 8-22 mins $Therapeutic Exercise: 8-22 mins G-Codes: OT G-codes **NOT FOR INPATIENT  CLASS** Functional Assessment Tool Used: clinical observation Functional Limitation: Self care Self Care Current Status ([id number]): At least 60 percent but less than 80 percent impaired, limited or restricted Self Care Goal Status ([id number](G8988): At least 60 percent but less than 80 percent impaired, limited or restricted Self Care Discharge Status [id number]([id number]): At least 60 percent but less than 80 percent impaired, limited or restricted  [facility]ellen Lenisha Lacap 03/14/2014, 10:34 AM Marica OtterMaryellen Donyea Gafford, OTR/L [phone] 03/14/2014

## 2021-07-10 ENCOUNTER — Ambulatory Visit (INDEPENDENT_AMBULATORY_CARE_PROVIDER_SITE_OTHER): Payer: Medicare Other

## 2021-07-10 ENCOUNTER — Other Ambulatory Visit: Payer: Self-pay | Admitting: Family Medicine

## 2021-07-10 ENCOUNTER — Other Ambulatory Visit: Payer: Self-pay

## 2021-07-10 DIAGNOSIS — R0602 Shortness of breath: Secondary | ICD-10-CM | POA: Diagnosis not present

## 2023-04-30 IMAGING — DX DG CHEST 2V
2 series · 2 of 2 positions shown · non-contrast
Comparison: 03/04/2014

CLINICAL DATA: Dyspnea, increasing shortness of breath with
exertion

EXAM:
CHEST - 2 VIEW

[chest pa]
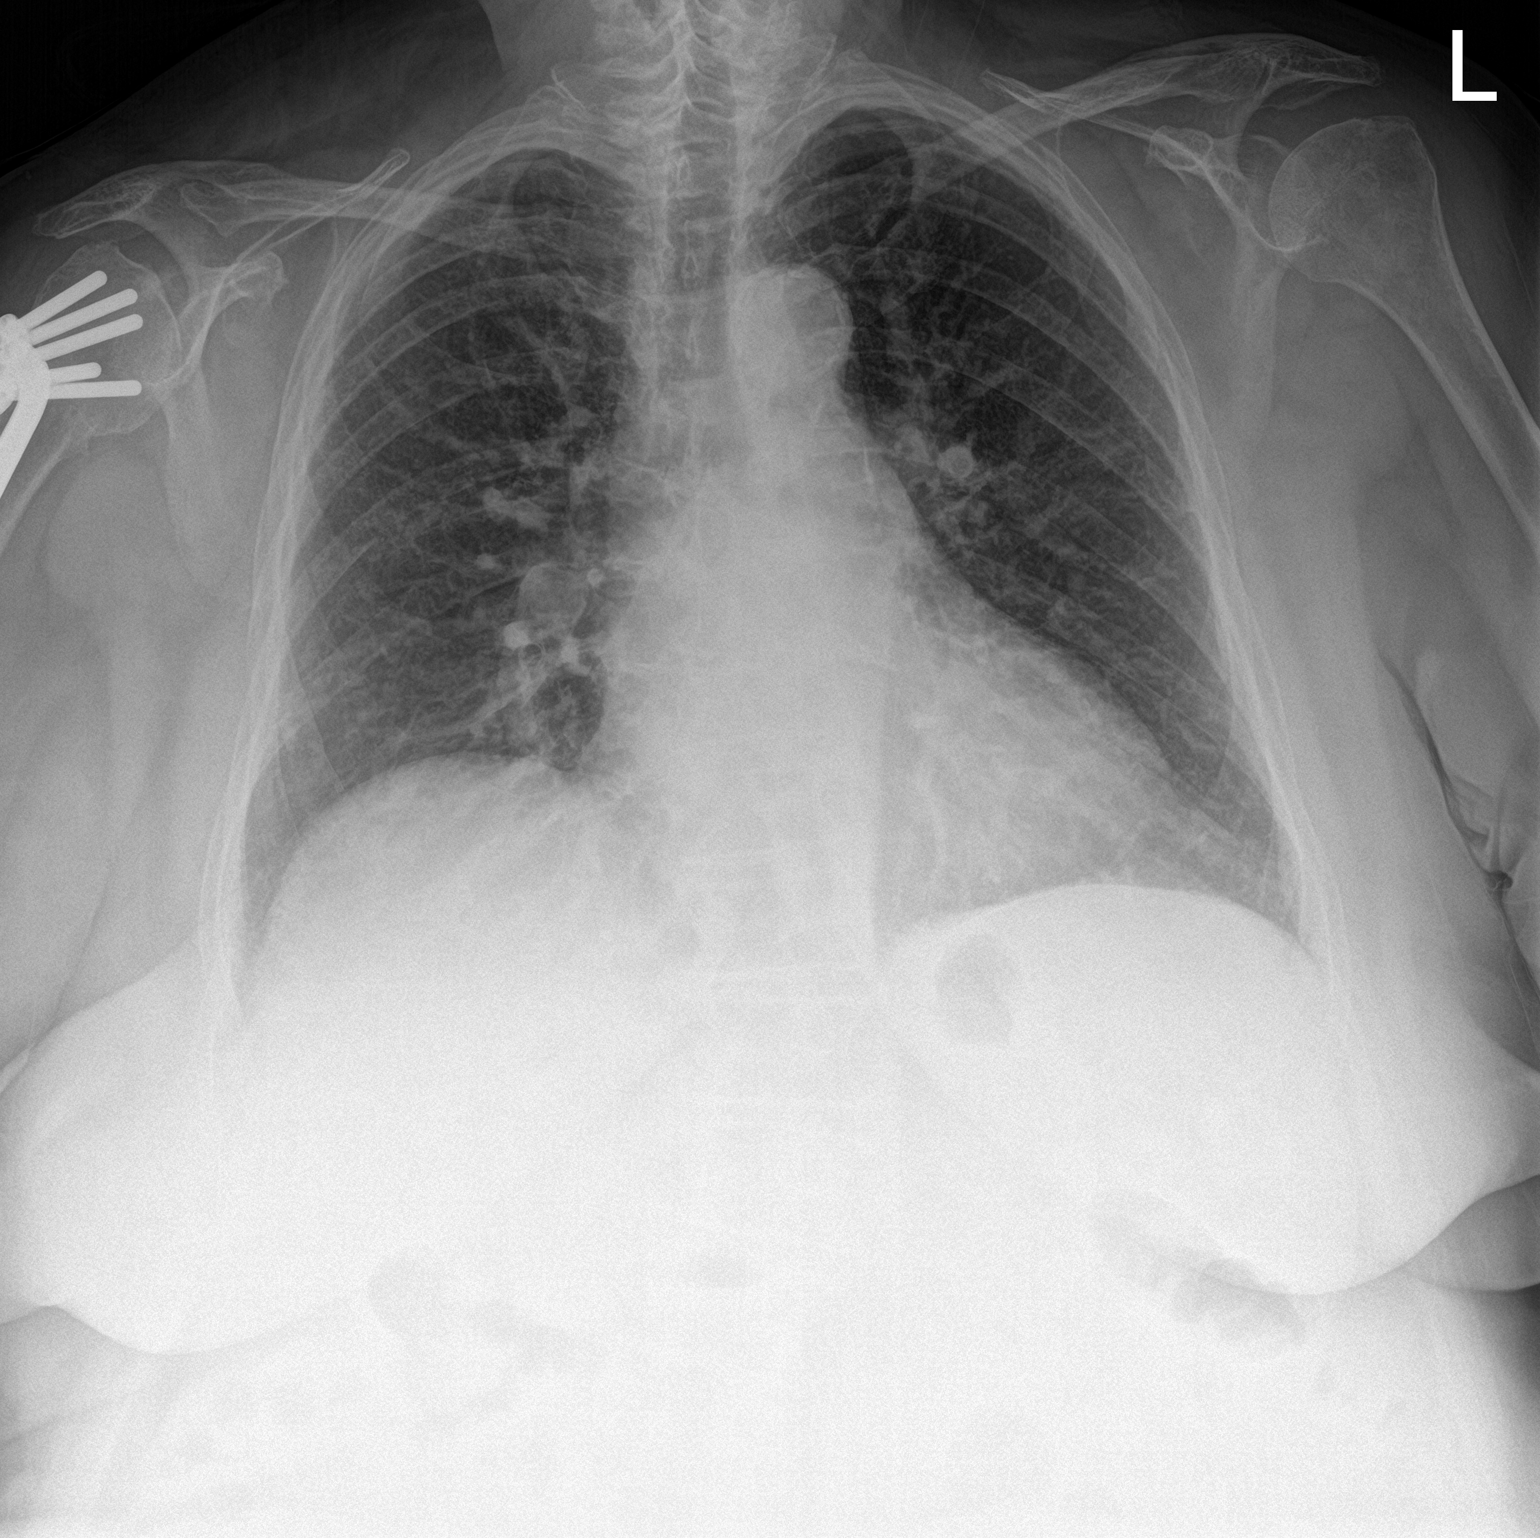

[chest lat]
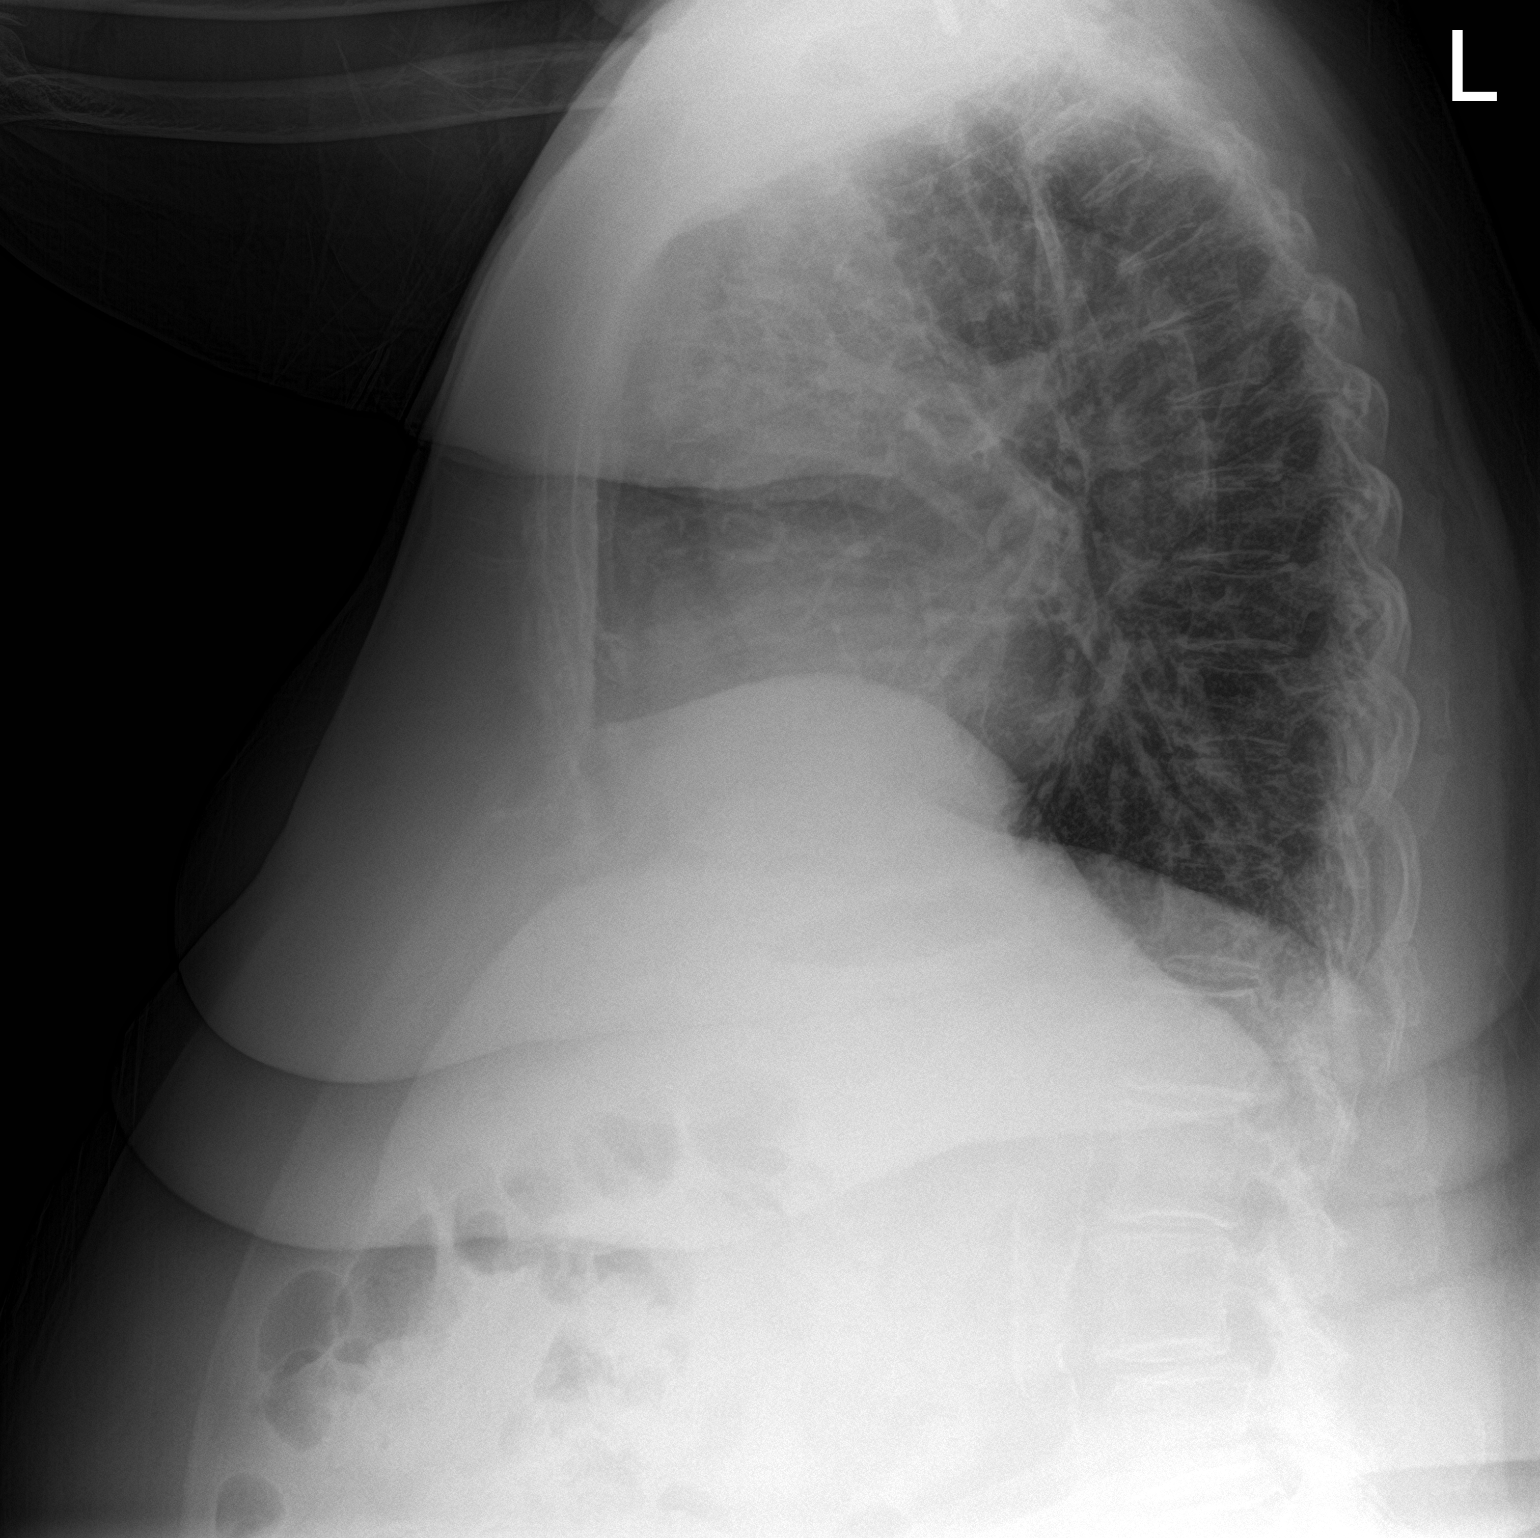

[2 of 2 positions shown; findings below may reference images not displayed]

FINDINGS: Enlargement of cardiac silhouette.

Mediastinal contours and pulmonary vascularity normal.

Atherosclerotic calcification aorta.

Lungs clear.

No infiltrate, pleural effusion, or pneumothorax.

Bones demineralized with postsurgical changes of the proximal RIGHT
humerus.
IMPRESSION: Enlargement of cardiac silhouette.

No acute abnormalities.

Aortic Atherosclerosis ([id number]).
